# Patient Record
Sex: Female | Born: 2000 | Race: Black or African American | Hispanic: No | Marital: Single | Smoking: Current every day smoker
Health system: Southern US, Community
[De-identification: ages and names within clinical notes are randomized; demographics above are authoritative.]

## PROBLEM LIST (undated history)

## (undated) ENCOUNTER — Inpatient Hospital Stay (HOSPITAL_COMMUNITY): Payer: Self-pay

## (undated) DIAGNOSIS — J45909 Unspecified asthma, uncomplicated: Secondary | ICD-10-CM

## (undated) DIAGNOSIS — Z789 Other specified health status: Secondary | ICD-10-CM

## (undated) HISTORY — PX: TYMPANOSTOMY TUBE PLACEMENT: SHX32

## (undated) HISTORY — PX: NO PAST SURGERIES: SHX2092

---
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
## 2021-07-15 ENCOUNTER — Encounter (HOSPITAL_COMMUNITY): Payer: Self-pay | Admitting: Student

## 2021-07-15 ENCOUNTER — Emergency Department (HOSPITAL_COMMUNITY): Payer: Medicaid Other

## 2021-07-15 ENCOUNTER — Emergency Department (HOSPITAL_COMMUNITY)
Admission: EM | Admit: 2021-07-15 | Discharge: 2021-07-15 | Disposition: A | Discharge status: Home / Self Care | Payer: Medicaid Other | Attending: Emergency Medicine | Admitting: Emergency Medicine

## 2021-07-15 ENCOUNTER — Other Ambulatory Visit: Payer: Self-pay

## 2021-07-15 DIAGNOSIS — Y92481 Parking lot as the place of occurrence of the external cause: Secondary | ICD-10-CM | POA: Diagnosis not present

## 2021-07-15 DIAGNOSIS — S9031XA Contusion of right foot, initial encounter: Secondary | ICD-10-CM | POA: Insufficient documentation

## 2021-07-15 DIAGNOSIS — J4520 Mild intermittent asthma, uncomplicated: Secondary | ICD-10-CM | POA: Diagnosis not present

## 2021-07-15 DIAGNOSIS — S99921A Unspecified injury of right foot, initial encounter: Secondary | ICD-10-CM | POA: Diagnosis present
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
## 2021-07-15 NOTE — Discharge Instructions (Addendum)
Get help right away if: You suddenly develop severe pain in your foot. You previously had sensation in your foot and you suddenly lose sensation. Your symptoms had improved and they suddenly get worse. Your foot or toes are turning pink or blue. 
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
## 2021-10-03 NOTE — L&D Delivery Note (Signed)
OB/[facility]N Faculty Practice Delivery Note  Phyllis Yates is a 21 y.o. G2P1001 s/p VBAC at [redacted]w[redacted]d. She presented to the MAU in active labor.   ROM: 0h 67m with clear fluid, ruptured upon delivery  GBS Status:  Negative/-- (10/17 1131) Maximum Maternal Temperature:  Temp (24hrs), Avg:97.9 F (36.6 C), Min:97.5 F (36.4 C), Max:98.2 F (36.8 C)    Labor Progress: Patient arrived at 10 cm dilation and expectantly managed.   Delivery Date/Time: 08/08/2022 at [phone] Delivery: Patient presented to the MAU in active labor and complete with bulging bag at the vaginal introitus. Fetus with decels with contractions, so patient was encouraged to push. Head delivered in ROA position. No nuchal cord present. Shoulder and body delivered in usual fashion. Infant with spontaneous cry, placed on mother's abdomen, dried and stimulated. Cord clamped x 2 after 1-minute delay, and cut by FOB. Cord blood drawn. Placenta delivered spontaneously with gentle cord traction. Fundus firm with massage and Pitocin. However, there was a brisk flow followed by continuous trickle. Managed with TXA, IM methergine, and emptying of bladder with I&O catheter. At this point uterus was firm and bleeding had resolved. Labia, perineum, vagina, and cervix inspected with bilateral periurethral tears visualized.   Placenta: Delivered intact at [phone], trailing membranes required use of ring foreceps  Complications: Bleeding post partum, managed with postpartum pitocin, TXA, methergine, I%O cath, resolved bleeding  Lacerations: bilateral periurethral lacerations, hemostatic not requiring repair  EBL: 750 Analgesia: IV fentanyl 100 mcg post delivery    Infant: APGAR (1 MIN): 8   APGAR (5 MINS): 9   APGAR (10 MINS):    Weight: pending   Lowry Ram, MD  PGY-1, [facility]  08/08/2022 9:30 AM

## 2022-05-16 ENCOUNTER — Telehealth: Payer: Self-pay | Admitting: *Deleted

## 2022-05-16 NOTE — Telephone Encounter (Signed)
Left patient an urgent message with all appointment/office information.

## 2022-05-30 ENCOUNTER — Encounter: Payer: Self-pay | Admitting: *Deleted

## 2022-05-31 ENCOUNTER — Encounter: Payer: Self-pay | Admitting: Obstetrics and Gynecology

## 2022-05-31 ENCOUNTER — Other Ambulatory Visit (HOSPITAL_COMMUNITY)
Admission: RE | Admit: 2022-05-31 | Discharge: 2022-05-31 | Disposition: A | Discharge status: Home / Self Care | Payer: Medicaid Other | Source: Ambulatory Visit | Attending: Obstetrics and Gynecology | Admitting: Obstetrics and Gynecology

## 2022-05-31 ENCOUNTER — Ambulatory Visit (INDEPENDENT_AMBULATORY_CARE_PROVIDER_SITE_OTHER): Payer: Medicaid Other | Admitting: Obstetrics and Gynecology

## 2022-05-31 DIAGNOSIS — Z3A29 29 weeks gestation of pregnancy: Secondary | ICD-10-CM | POA: Diagnosis not present

## 2022-05-31 DIAGNOSIS — Z3483 Encounter for supervision of other normal pregnancy, third trimester: Secondary | ICD-10-CM

## 2022-05-31 DIAGNOSIS — Z3480 Encounter for supervision of other normal pregnancy, unspecified trimester: Secondary | ICD-10-CM | POA: Diagnosis not present

## 2022-05-31 DIAGNOSIS — Z3400 Encounter for supervision of normal first pregnancy, unspecified trimester: Secondary | ICD-10-CM

## 2022-05-31 LAB — HEPATITIS C ANTIBODY: HCV Ab: NEGATIVE

## 2022-05-31 MED ORDER — ONDANSETRON HCL 4 MG PO TABS: 4.0000 mg | ORAL_TABLET | Freq: Once | ORAL | Status: DC

## 2022-05-31 MED ORDER — ONDANSETRON HCL 8 MG PO TABS: 8.0000 mg | ORAL_TABLET | Freq: Three times a day (TID) | ORAL | 0 refills | Status: DC | PRN

## 2022-05-31 MED ORDER — ALBUTEROL SULFATE HFA 108 (90 BASE) MCG/ACT IN AERS: 1.0000 | INHALATION_SPRAY | Freq: Four times a day (QID) | RESPIRATORY_TRACT | 1 refills | Status: DC | PRN

## 2022-05-31 MED ORDER — PRENATAL GUMMIES/DHA & FA 0.4-32.5 MG PO CHEW: 2.0000 | CHEWABLE_TABLET | Freq: Every day | ORAL | 9 refills | Status: DC

## 2022-05-31 NOTE — Progress Notes (Signed)
History:   Phyllis Yates is a 21 y.o. G2P1001 at [redacted]w[redacted]d by LMP being seen today for her first obstetrical visit.  Her obstetrical history is significant for asthma, late to care and smoker. Patient does intend to breast feed. Pregnancy history fully reviewed. FOB involved. Mom is present today with her.  Her pregnancy she received care at [facility].   C/s with first baby due to fetal distress. Interested in [facility]   Patient reports no complaints.  HISTORY: OB History  Gravida Para Term Preterm AB Living  2 1 1  0 0 1  SAB IAB Ectopic Multiple Live Births  0 0 0 0 0    # Outcome Date GA Lbr Len/2nd Weight Sex Delivery Anes PTL Lv  2 Current           1 Term      CS-Unspec       Last pap smear was done NA age.   Past Medical History:  Diagnosis Date   Asthma    Medical history non-contributory    Past Surgical History:  Procedure Laterality Date   CESAREAN SECTION N/A 05/16/2019   Procedure: CESAREAN SECTION;  Surgeon: 05/18/2019, MD;  Location: [facility];  Service: Obstetrics;  Laterality: N/A;   NO PAST SURGERIES     TYMPANOSTOMY TUBE PLACEMENT     Family History  Problem Relation Age of Onset   Urticaria Father    Asthma Brother    Eczema Brother    Urticaria Paternal Aunt    Urticaria Paternal Grandfather    Social History   Tobacco Use   Smoking status: Never   Smokeless tobacco: Never  Vaping Use   Vaping Use: Never used  Substance Use Topics   Alcohol use: No   Drug use: Yes    Types: Marijuana   No Known Allergies Current Outpatient Medications on File Prior to Visit  Medication Sig Dispense Refill   acetaminophen (TYLENOL) 500 MG tablet Take 1,000 mg by mouth every 8 (eight) hours as needed for mild pain or headache. (Patient not taking: Reported on 05/31/2022)     cetirizine (ZYRTEC ALLERGY) 10 MG tablet Take 1 tablet (10 mg total) by mouth 2 (two) times daily. (Patient not taking: Reported on 05/16/2019) 60 tablet 0   EPINEPHrine (EPIPEN 2-PAK)  0.3 mg/0.3 mL IJ SOAJ injection Inject 0.3 mLs (0.3 mg total) into the muscle once as needed (for severe allergic reaction). CAll 911 immediately if you have to use this medicine (Patient not taking: Reported on 05/31/2022) 2 Device 0   fluticasone (FLONASE) 50 MCG/ACT nasal spray Place 2 sprays into both nostrils daily. (Patient not taking: Reported on 05/16/2019) 1 g 5   ibuprofen (ADVIL) 600 MG tablet Take 1 tablet (600 mg total) by mouth every 6 (six) hours as needed for mild pain. (Patient not taking: Reported on 05/31/2022) 30 tablet 0   oxyCODONE (OXY IR/ROXICODONE) 5 MG immediate release tablet Take 1-2 tablets (5-10 mg total) by mouth every 4 (four) hours as needed for moderate pain. (Patient not taking: Reported on 05/31/2022) 25 tablet 0   Current Facility-Administered Medications on File Prior to Visit  Medication Dose Route Frequency Provider Last Rate Last Admin   medroxyPROGESTERone (DEPO-PROVERA) injection 150 mg  150 mg Intramuscular Q90 days 06/02/2022, CNM   150 mg at 08/14/19 1021    Review of Systems Pertinent items noted in HPI and remainder of comprehensive ROS otherwise negative.  Physical Exam:   Vitals:  05/31/22 1334  BP: 105/69  Pulse: 92  Weight: 184 lb (83.5 kg)   Fetal Heart Rate (bpm): 156  Uterine size:    General: well-developed, well-nourished female in no acute distress  Breasts:  Deferred   Skin: normal coloration and turgor, no rashes  Neurologic: oriented, normal, negative, normal mood  Extremities: normal strength, tone, and muscle mass, ROM of all joints is normal  HEENT PERRLA, extraocular movement intact and sclera clear, anicteric  Neck supple and no masses  Cardiovascular: regular rate and rhythm  Respiratory:  no respiratory distress, normal breath sounds  Abdomen: soft, non-tender; bowel sounds normal; no masses,  no organomegaly  Pelvic: Deferred     Assessment:    Pregnancy: G2P1001 Patient Active Problem List   Diagnosis Date  Noted   Supervision of other normal pregnancy, antepartum 05/31/2022   Status post cesarean delivery 05/19/2019   Angioedema 06/11/2018   Mild intermittent asthma 06/11/2018     Plan:      1. Supervision of other normal pregnancy, antepartum  - HIV Antibody (routine testing w rflx) - Obstetric panel - Culture, OB Urine - Hepatitis C antibody - GC/Chlamydia probe amp ([place])not at [facility] - [place] MFM OB COMP + 14 WK; Future - HgB A1c - Scheduled 2 hour GTT ASAP  2. Supervision of normal first pregnancy, antepartum  - Prenatal MV-Min-FA-Omega-3 (PRENATAL GUMMIES/DHA & FA) 0.4-32.5 MG CHEW; Chew 2 each by mouth daily.  Dispense: 60 tablet; Refill: 9  - RX albuterol inhaler. Keep in your purse.    Initial labs drawn. Continue prenatal vitamins. Problem list reviewed and updated. Genetic Screening discussed, Panorama and Horizon: requested. Ultrasound discussed; fetal anatomic survey: planned. Anticipatory guidance about prenatal visits given including labs, ultrasounds, and testing. Weight gain recommendations per IOM guidelines reviewed: underweight/BMI 18.5 or less > 28 - 40 lbs; normal weight/BMI 18.5 - 24.9 > 25 - 35 lbs; overweight/BMI 25 - 29.9 > 15 - 25 lbs; obese/BMI  30 or more > 11 - 20 lbs. Discussed usage of the Babyscripts app for more information about pregnancy, and to track blood pressures. Also discussed usage of virtual visits as additional source of managing and completing prenatal visits.  Patient was encouraged to use MyChart to review results, send requests, and have questions addressed.   The nature of [place] [facility] with multiple MDs and Advanced Practice Providers was explained to patient; also emphasized that residents, students are part of our team. Routine obstetric precautions reviewed. Encouraged to seek out care at our office or emergency room [facility] MAU preferred) for urgent and/or emergent  concerns. Return for Glucose testing ASAP.     Sohail Capraro, Harolyn Rutherford, NP Faculty Practice [facility] [employer], [facility]

## 2022-06-01 ENCOUNTER — Telehealth: Payer: Self-pay | Admitting: *Deleted

## 2022-06-01 LAB — GC/CHLAMYDIA PROBE AMP (~~LOC~~) NOT AT ARMC
Comment: NEGATIVE
Comment: NORMAL

## 2022-06-01 NOTE — Telephone Encounter (Signed)
Left patient an urgent message to call the office to get appointment scheduled as soon as possible.

## 2022-06-02 LAB — OBSTETRIC PANEL
Absolute Monocytes: 410 cells/uL (ref 200–950)
Antibody Screen: NOT DETECTED
Basophils Absolute: 30 cells/uL (ref 0–200)
Basophils Relative: 0.3 %
Eosinophils Absolute: 90 cells/uL (ref 15–500)
Eosinophils Relative: 0.9 %
HCT: 34.3 % — ABNORMAL LOW (ref 35.0–45.0)
Hemoglobin: 10.6 g/dL — ABNORMAL LOW (ref 11.7–15.5)
Hepatitis B Surface Ag: NONREACTIVE
Lymphs Abs: 880 cells/uL (ref 850–3900)
MCH: 28.3 pg (ref 27.0–33.0)
MCHC: 30.9 g/dL — ABNORMAL LOW (ref 32.0–36.0)
MCV: 91.7 fL (ref 80.0–100.0)
MPV: 10.8 fL (ref 7.5–12.5)
Monocytes Relative: 4.1 %
Neutro Abs: 8590 cells/uL — ABNORMAL HIGH (ref 1500–7800)
Neutrophils Relative %: 85.9 %
Platelets: 204 10*3/uL (ref 140–400)
RBC: 3.74 10*6/uL — ABNORMAL LOW (ref 3.80–5.10)
RDW: 12.6 % (ref 11.0–15.0)
RPR Ser Ql: NONREACTIVE
Rubella: 3.58 Index
Total Lymphocyte: 8.8 %
WBC: 10 10*3/uL (ref 3.8–10.8)

## 2022-06-02 LAB — HEMOGLOBIN A1C
Hgb A1c MFr Bld: 5.1 % of total Hgb (ref <5.7)
Mean Plasma Glucose: 100 mg/dL
eAG (mmol/L): 5.5 mmol/L

## 2022-06-02 LAB — HEPATITIS C ANTIBODY: Hepatitis C Ab: NONREACTIVE

## 2022-06-02 LAB — CULTURE, OB URINE

## 2022-06-02 LAB — URINE CULTURE, OB REFLEX

## 2022-06-02 LAB — HIV ANTIBODY (ROUTINE TESTING W REFLEX): HIV 1&2 Ab, 4th Generation: NONREACTIVE

## 2022-06-10 ENCOUNTER — Ambulatory Visit (INDEPENDENT_AMBULATORY_CARE_PROVIDER_SITE_OTHER): Payer: Medicaid Other | Admitting: Obstetrics and Gynecology

## 2022-06-10 VITALS — BP 119/64 | HR 83 | Wt 179.0 lb

## 2022-06-10 DIAGNOSIS — Z720 Tobacco use: Secondary | ICD-10-CM

## 2022-06-10 DIAGNOSIS — Z98891 History of uterine scar from previous surgery: Secondary | ICD-10-CM

## 2022-06-10 DIAGNOSIS — Z3483 Encounter for supervision of other normal pregnancy, third trimester: Secondary | ICD-10-CM

## 2022-06-10 DIAGNOSIS — O0930 Supervision of pregnancy with insufficient antenatal care, unspecified trimester: Secondary | ICD-10-CM | POA: Insufficient documentation

## 2022-06-10 DIAGNOSIS — Z3A30 30 weeks gestation of pregnancy: Secondary | ICD-10-CM

## 2022-06-10 DIAGNOSIS — Z3480 Encounter for supervision of other normal pregnancy, unspecified trimester: Secondary | ICD-10-CM

## 2022-06-10 DIAGNOSIS — O0933 Supervision of pregnancy with insufficient antenatal care, third trimester: Secondary | ICD-10-CM

## 2022-06-10 MED ORDER — NICOTINE 21 MG/24HR TD PT24: 21.0000 mg | MEDICATED_PATCH | Freq: Every day | TRANSDERMAL | 0 refills | Status: DC

## 2022-06-10 NOTE — Progress Notes (Signed)
PRENATAL VISIT NOTE  Subjective:  Phyllis Yates is a 21 y.o. G2P1001 at [redacted]w[redacted]d being seen today for ongoing prenatal care.  She is currently monitored for the following issues for this low-risk pregnancy and has Angioedema; Mild intermittent asthma; Status post cesarean delivery; Supervision of other normal pregnancy, antepartum; Tobacco abuse; and Late prenatal care on their problem list.  Patient reports no complaints.  Contractions: Not present. Vag. Bleeding: None.  Movement: Present. Denies leaking of fluid.   The following portions of the patient's history were reviewed and updated as appropriate: allergies, current medications, past family history, past medical history, past social history, past surgical history and problem list.   Objective:   Vitals:   06/10/22 1022  BP: 119/64  Pulse: 83  Weight: 179 lb (81.2 kg)    Fetal Status: Fetal Heart Rate (bpm): 135 Fundal Height: 30 cm Movement: Present     General:  Alert, oriented and cooperative. Patient is in no acute distress.  Skin: Skin is warm and dry. No rash noted.   Cardiovascular: Normal heart rate noted  Respiratory: Normal respiratory effort, no problems with respiration noted  Abdomen: Soft, gravid, appropriate for gestational age.  Pain/Pressure: Present     Pelvic: Cervical exam deferred        Extremities: Normal range of motion.  Edema: None  Mental Status: Normal mood and affect. Normal behavior. Normal judgment and thought content.   Assessment and Plan:  Pregnancy: G2P1001 at [redacted]w[redacted]d 1. Supervision of other normal pregnancy, antepartum Will schedule for anatomy scan Needs 2 hr gtt - she will come for lab draw next week.  Offered and recommended flu shot. Pt declines today but would be willing next time.   2. Status post cesarean delivery - We discussed her history of c-section. Her previous c-section was due to  non-reassuring fetal heart tones and she dilated to 5 cm.  She has a history of  no prior  successful vaginal deliveries - We discussed the risks associated with repeat c-section: bleeding, infection, injury to surrounding organs/tissues I.e. bowel/bladder, development of scar tissue, wound complications such as wound separation or infection, need for additional surgery, percreta/acreta - We discussed the risks associated with TOLAC: risk of it being unsuccessful, specially in the context of her history, the risks in general of a vaginal delivery (prolapse, SUI, differences in recovery, pelvic floor dysfunction, etc), and the risk of uterine rupture. We discussed with the risk of uterine rupture that while rare it is not easily predicted, that it is a surgical emergency, and it can be potentially catastrophic for mom and baby. We discussed if uterine rupture that it may necessitate hysterectomy if the rupture caused issues with bleeding that could not be managed with other surgical options.  - After counseling, the patient was given the opportunity to ask questions and all questions answered.  - After considering her options, she would like to [facility] - Information provided to the patient  3. Tobacco abuse Discussed options for help with cessation. She would like to try the patch. She reports she has cut back a lot. Will do 21 mg patch and then will need to taper after next time  Preterm labor symptoms and general obstetric precautions including but not limited to vaginal bleeding, contractions, leaking of fluid and fetal movement were reviewed in detail with the patient. Please refer to After Visit Summary for other counseling recommendations.   Return in about 2 weeks (around 06/24/2022) for OB VISIT, MD or APP, 2  hr GTT.  Future Appointments  Date Time Provider Department Center  07/01/2022  1:45 PM [facility] US[facility]    Milas Hock, MD

## 2022-06-15 ENCOUNTER — Other Ambulatory Visit: Payer: Medicaid Other

## 2022-06-23 ENCOUNTER — Ambulatory Visit (INDEPENDENT_AMBULATORY_CARE_PROVIDER_SITE_OTHER): Payer: Medicaid Other | Admitting: Family Medicine

## 2022-06-23 ENCOUNTER — Encounter: Payer: Medicaid Other | Admitting: Family Medicine

## 2022-06-23 VITALS — BP 115/64 | HR 83 | Wt 188.0 lb

## 2022-06-23 DIAGNOSIS — Z3483 Encounter for supervision of other normal pregnancy, third trimester: Secondary | ICD-10-CM

## 2022-06-23 DIAGNOSIS — Z98891 History of uterine scar from previous surgery: Secondary | ICD-10-CM

## 2022-06-23 DIAGNOSIS — J4520 Mild intermittent asthma, uncomplicated: Secondary | ICD-10-CM

## 2022-06-23 DIAGNOSIS — Z720 Tobacco use: Secondary | ICD-10-CM

## 2022-06-23 DIAGNOSIS — Z3480 Encounter for supervision of other normal pregnancy, unspecified trimester: Secondary | ICD-10-CM

## 2022-06-23 DIAGNOSIS — Z3A32 32 weeks gestation of pregnancy: Secondary | ICD-10-CM

## 2022-06-23 NOTE — Progress Notes (Signed)
   PRENATAL VISIT NOTE  Subjective:  Phyllis Yates is a 21 y.o. G2P1001 at [redacted]w[redacted]d being seen today for ongoing prenatal care.  She is currently monitored for the following issues for this low-risk pregnancy and has Angioedema; Mild intermittent asthma; Status post cesarean delivery; Supervision of other normal pregnancy, antepartum; Tobacco abuse; and Late prenatal care on their problem list.  Patient reports no complaints.  Contractions: Not present. Vag. Bleeding: None.  Movement: Present. Denies leaking of fluid.   The following portions of the patient's history were reviewed and updated as appropriate: allergies, current medications, past family history, past medical history, past social history, past surgical history and problem list.   Objective:   Vitals:   06/23/22 0939  BP: 115/64  Pulse: 83  Weight: 188 lb (85.3 kg)    Fetal Status: Fetal Heart Rate (bpm): 145   Movement: Present     General:  Alert, oriented and cooperative. Patient is in no acute distress.  Skin: Skin is warm and dry. No rash noted.   Cardiovascular: Normal heart rate noted  Respiratory: Normal respiratory effort, no problems with respiration noted  Abdomen: Soft, gravid, appropriate for gestational age.  Pain/Pressure: Present     Pelvic: Cervical exam deferred        Extremities: Normal range of motion.  Edema: None  Mental Status: Normal mood and affect. Normal behavior. Normal judgment and thought content.   Assessment and Plan:  Pregnancy: G2P1001 at [redacted]w[redacted]d 1. [redacted] weeks gestation of pregnancy - RPR - CBC - Glucose Tolerance, 2 Hours w/1 Hour - HIV Antibody (routine testing w rflx)  2. Supervision of other normal pregnancy, antepartum FHT and FH normal  3. Status post cesarean delivery Still considering TOLAC  4. Mild intermittent asthma, unspecified whether complicated Controlled - uses SABA once every couple of weeks  5. Tobacco abuse Still smoking - has not picked up patches  yet.  Preterm labor symptoms and general obstetric precautions including but not limited to vaginal bleeding, contractions, leaking of fluid and fetal movement were reviewed in detail with the patient. Please refer to After Visit Summary for other counseling recommendations.   No follow-ups on file.  Future Appointments  Date Time Provider [place]  07/01/2022  2:00 PM [facility] US1 [facility]  07/07/2022  1:30 PM Truett Mainland, DO [facility] None  07/19/2022 10:55 AM Laury Deep, CNM [facility] None  07/26/2022 10:55 AM Seabron Spates, CNM [facility] None  08/04/2022  1:10 PM Truett Mainland, DO [facility] None  08/11/2022  2:10 PM Truett Mainland, DO [facility] None    Truett Mainland, DO

## 2022-06-24 LAB — CBC
Hematocrit: 31.8 % — ABNORMAL LOW (ref 34.0–46.6)
Hemoglobin: 10.4 g/dL — ABNORMAL LOW (ref 11.1–15.9)
MCH: 28.5 pg (ref 26.6–33.0)
MCHC: 32.7 g/dL (ref 31.5–35.7)
MCV: 87 fL (ref 79–97)
Platelets: 218 10*3/uL (ref 150–450)
RBC: 3.65 x10E6/uL — ABNORMAL LOW (ref 3.77–5.28)
RDW: 13.1 % (ref 11.7–15.4)
WBC: 10.8 10*3/uL (ref 3.4–10.8)

## 2022-06-24 LAB — GLUCOSE TOLERANCE, 2 HOURS W/ 1HR
Glucose, 1 hour: 89 mg/dL (ref 70–179)
Glucose, 2 hour: 79 mg/dL (ref 70–152)
Glucose, Fasting: 77 mg/dL (ref 70–91)

## 2022-06-24 LAB — HIV ANTIBODY (ROUTINE TESTING W REFLEX): HIV Screen 4th Generation wRfx: NONREACTIVE

## 2022-06-24 LAB — RPR: RPR Ser Ql: NONREACTIVE

## 2022-06-29 ENCOUNTER — Encounter: Payer: Medicaid Other | Admitting: Family Medicine

## 2022-07-01 ENCOUNTER — Ambulatory Visit: Payer: Medicaid Other | Admitting: *Deleted

## 2022-07-01 ENCOUNTER — Ambulatory Visit: Payer: Medicaid Other | Attending: Obstetrics and Gynecology

## 2022-07-01 ENCOUNTER — Other Ambulatory Visit: Payer: Self-pay | Admitting: *Deleted

## 2022-07-01 VITALS — BP 111/67 | HR 76

## 2022-07-01 DIAGNOSIS — Z3A33 33 weeks gestation of pregnancy: Secondary | ICD-10-CM | POA: Insufficient documentation

## 2022-07-01 DIAGNOSIS — O0933 Supervision of pregnancy with insufficient antenatal care, third trimester: Secondary | ICD-10-CM | POA: Diagnosis not present

## 2022-07-01 DIAGNOSIS — Z3480 Encounter for supervision of other normal pregnancy, unspecified trimester: Secondary | ICD-10-CM

## 2022-07-01 DIAGNOSIS — O34219 Maternal care for unspecified type scar from previous cesarean delivery: Secondary | ICD-10-CM | POA: Diagnosis not present

## 2022-07-07 ENCOUNTER — Encounter: Payer: Medicaid Other | Admitting: Family Medicine

## 2022-07-19 ENCOUNTER — Ambulatory Visit (INDEPENDENT_AMBULATORY_CARE_PROVIDER_SITE_OTHER): Payer: Medicaid Other | Admitting: Obstetrics and Gynecology

## 2022-07-19 ENCOUNTER — Encounter: Payer: Self-pay | Admitting: Obstetrics and Gynecology

## 2022-07-19 ENCOUNTER — Encounter: Payer: Self-pay | Admitting: General Practice

## 2022-07-19 ENCOUNTER — Other Ambulatory Visit (HOSPITAL_COMMUNITY)
Admission: RE | Admit: 2022-07-19 | Discharge: 2022-07-19 | Disposition: A | Discharge status: Home / Self Care | Payer: Medicaid Other | Source: Ambulatory Visit | Attending: Obstetrics and Gynecology | Admitting: Obstetrics and Gynecology

## 2022-07-19 VITALS — BP 110/58 | HR 71 | Wt 189.0 lb

## 2022-07-19 DIAGNOSIS — Z3A36 36 weeks gestation of pregnancy: Secondary | ICD-10-CM | POA: Insufficient documentation

## 2022-07-19 DIAGNOSIS — Z3480 Encounter for supervision of other normal pregnancy, unspecified trimester: Secondary | ICD-10-CM

## 2022-07-19 DIAGNOSIS — O34219 Maternal care for unspecified type scar from previous cesarean delivery: Secondary | ICD-10-CM

## 2022-07-19 NOTE — Progress Notes (Signed)
   LOW-RISK PREGNANCY OFFICE VISIT Patient name: Phyllis Yates MRN [id number]  Date of birth: June 28, 2001 Chief Complaint:   No chief complaint on file.  History of Present Illness:   Phyllis Yates is a 21 y.o. G70P1001 female at [redacted]w[redacted]d with an Estimated Date of Delivery: 08/15/22 being seen today for ongoing management of a low-risk pregnancy.  Today she reports occasional contractions. Contractions: Not present. Vag. Bleeding: None.  Movement: Present. denies leaking of fluid. Review of Systems:   Pertinent items are noted in HPI Denies abnormal vaginal discharge w/ itching/odor/irritation, headaches, visual changes, shortness of breath, chest pain, abdominal pain, severe nausea/vomiting, or problems with urination or bowel movements unless otherwise stated above. Pertinent History Reviewed:  Reviewed past medical,surgical, social, obstetrical and family history.  Reviewed problem list, medications and allergies. Physical Assessment:   Vitals:   07/19/22 1102  BP: (!) 110/58  Pulse: 71  Weight: 189 lb (85.7 kg)  Body mass index is 30.51 kg/m.        Physical Examination:   General appearance: Well appearing, and in no distress  Mental status: Alert, oriented to person, place, and time  Skin: Warm & dry  Cardiovascular: Normal heart rate noted  Respiratory: Normal respiratory effort, no distress  Abdomen: Soft, gravid, nontender  Pelvic: Cervical exam performed  Dilation: 1 Effacement (%): 50 Station: -3  Extremities: Edema: None  Fetal Status:   Fundal Height: 34 cm Movement: Present Presentation: Vertex  No results found for this or any previous visit (from the past 24 hour(s)).  Assessment & Plan:  1) Low-risk pregnancy G2P1001 at [redacted]w[redacted]d with an Estimated Date of Delivery: 08/15/22   2) Supervision of other normal pregnancy, antepartum  - Culture, beta strep (group b only),  - GC/Chlamydia probe amp ([place])not at [facility] - Discussed labor s/sx's.  3) Previous cesarean  delivery affecting pregnancy  - Planning TOLAC >> consent signed previously  4) [redacted] weeks gestation of pregnancy   Meds: No orders of the defined types were placed in this encounter.  Labs/procedures today: GBS, GC/CT and cervical exam  Plan:  Continue routine obstetrical care   Reviewed: Preterm labor symptoms and general obstetric precautions including but not limited to vaginal bleeding, contractions, leaking of fluid and fetal movement were reviewed in detail with the patient.  All questions were answered. Has home bp cuff. Check bp weekly, let us know if >140/90.   Follow-up: Return in about 1 week (around 07/26/2022) for Return OB visit.  Orders Placed This Encounter  Procedures   Culture, beta strep (group b only)   Laury Deep MSN, CNM 07/19/2022 11:36 AM

## 2022-07-20 LAB — GC/CHLAMYDIA PROBE AMP (~~LOC~~) NOT AT ARMC
Chlamydia: NEGATIVE
Comment: NEGATIVE
Comment: NORMAL
Neisseria Gonorrhea: NEGATIVE

## 2022-07-23 LAB — CULTURE, BETA STREP (GROUP B ONLY): Strep Gp B Culture: NEGATIVE

## 2022-07-24 ENCOUNTER — Encounter: Payer: Self-pay | Admitting: Obstetrics and Gynecology

## 2022-07-26 ENCOUNTER — Encounter: Payer: Medicaid Other | Admitting: Advanced Practice Midwife

## 2022-07-29 ENCOUNTER — Ambulatory Visit: Payer: Medicaid Other | Attending: Obstetrics and Gynecology

## 2022-07-29 ENCOUNTER — Ambulatory Visit: Payer: Medicaid Other | Admitting: *Deleted

## 2022-07-29 VITALS — BP 120/67 | HR 79

## 2022-07-29 DIAGNOSIS — O34219 Maternal care for unspecified type scar from previous cesarean delivery: Secondary | ICD-10-CM | POA: Diagnosis not present

## 2022-07-29 DIAGNOSIS — Z3A37 37 weeks gestation of pregnancy: Secondary | ICD-10-CM | POA: Diagnosis not present

## 2022-07-29 DIAGNOSIS — O0933 Supervision of pregnancy with insufficient antenatal care, third trimester: Secondary | ICD-10-CM | POA: Insufficient documentation

## 2022-07-29 DIAGNOSIS — Z3A27 27 weeks gestation of pregnancy: Secondary | ICD-10-CM | POA: Diagnosis not present

## 2022-07-29 DIAGNOSIS — Z3480 Encounter for supervision of other normal pregnancy, unspecified trimester: Secondary | ICD-10-CM | POA: Insufficient documentation

## 2022-07-29 DIAGNOSIS — O99333 Smoking (tobacco) complicating pregnancy, third trimester: Secondary | ICD-10-CM | POA: Diagnosis not present

## 2022-08-04 ENCOUNTER — Ambulatory Visit (INDEPENDENT_AMBULATORY_CARE_PROVIDER_SITE_OTHER): Payer: Medicaid Other | Admitting: Family Medicine

## 2022-08-04 VITALS — BP 117/67 | HR 75 | Wt 183.0 lb

## 2022-08-04 DIAGNOSIS — Z98891 History of uterine scar from previous surgery: Secondary | ICD-10-CM

## 2022-08-04 DIAGNOSIS — Z720 Tobacco use: Secondary | ICD-10-CM

## 2022-08-04 DIAGNOSIS — Z3A38 38 weeks gestation of pregnancy: Secondary | ICD-10-CM

## 2022-08-04 DIAGNOSIS — Z3480 Encounter for supervision of other normal pregnancy, unspecified trimester: Secondary | ICD-10-CM

## 2022-08-04 DIAGNOSIS — J4520 Mild intermittent asthma, uncomplicated: Secondary | ICD-10-CM

## 2022-08-04 NOTE — Progress Notes (Signed)
   PRENATAL VISIT NOTE  Subjective:  Phyllis Yates is a 21 y.o. G2P1001 at [redacted]w[redacted]d being seen today for ongoing prenatal care.  She is currently monitored for the following issues for this low-risk pregnancy and has Angioedema; Mild intermittent asthma; Status post cesarean delivery; Supervision of other normal pregnancy, antepartum; Tobacco abuse; and Late prenatal care on their problem list.  Patient reports occasional contractions.  Contractions: Irritability.  .  Movement: Present. Denies leaking of fluid.   The following portions of the patient's history were reviewed and updated as appropriate: allergies, current medications, past family history, past medical history, past social history, past surgical history and problem list.   Objective:   Vitals:   08/04/22 1357  BP: 117/67  Pulse: 75  Weight: 183 lb (83 kg)    Fetal Status: Fetal Heart Rate (bpm): 140   Movement: Present     General:  Alert, oriented and cooperative. Patient is in no acute distress.  Skin: Skin is warm and dry. No rash noted.   Cardiovascular: Normal heart rate noted  Respiratory: Normal respiratory effort, no problems with respiration noted  Abdomen: Soft, gravid, appropriate for gestational age.  Pain/Pressure: Present     Pelvic: Cervical exam deferred        Extremities: Normal range of motion.  Edema: Trace  Mental Status: Normal mood and affect. Normal behavior. Normal judgment and thought content.   Assessment and Plan:  Pregnancy: G2P1001 at [redacted]w[redacted]d 1. Supervision of other normal pregnancy, antepartum FHT and Fh normal BPP if gets to term. Weekly appt until delivery. - [place] MFM FETAL BPP WO NON STRESS; Future  2. Status post cesarean delivery Desires TOLAC.  3. Tobacco abuse  4. Mild intermittent asthma, unspecified whether complicated   Term labor symptoms and general obstetric precautions including but not limited to vaginal bleeding, contractions, leaking of fluid and fetal movement were  reviewed in detail with the patient. Please refer to After Visit Summary for other counseling recommendations.   No follow-ups on file.  Future Appointments  Date Time Provider [place]  08/11/2022  2:10 PM Truett Mainland, DO [facility] None    Truett Mainland, DO

## 2022-08-08 ENCOUNTER — Inpatient Hospital Stay (HOSPITAL_COMMUNITY)
Admission: AD | Admit: 2022-08-08 | Discharge: 2022-08-10 | DRG: 807 | Disposition: A | Discharge status: Home / Self Care | Payer: Medicaid Other | Attending: Family Medicine | Admitting: Family Medicine

## 2022-08-08 ENCOUNTER — Encounter (HOSPITAL_COMMUNITY): Payer: Self-pay | Admitting: Family Medicine

## 2022-08-08 DIAGNOSIS — Z3A39 39 weeks gestation of pregnancy: Secondary | ICD-10-CM

## 2022-08-08 DIAGNOSIS — O99511 Diseases of the respiratory system complicating pregnancy, first trimester: Secondary | ICD-10-CM | POA: Diagnosis not present

## 2022-08-08 DIAGNOSIS — O9952 Diseases of the respiratory system complicating childbirth: Secondary | ICD-10-CM | POA: Diagnosis present

## 2022-08-08 DIAGNOSIS — O34219 Maternal care for unspecified type scar from previous cesarean delivery: Secondary | ICD-10-CM | POA: Diagnosis present

## 2022-08-08 DIAGNOSIS — O99334 Smoking (tobacco) complicating childbirth: Secondary | ICD-10-CM | POA: Diagnosis not present

## 2022-08-08 DIAGNOSIS — J4520 Mild intermittent asthma, uncomplicated: Secondary | ICD-10-CM | POA: Diagnosis present

## 2022-08-08 DIAGNOSIS — O9902 Anemia complicating childbirth: Secondary | ICD-10-CM | POA: Diagnosis present

## 2022-08-08 DIAGNOSIS — Z87891 Personal history of nicotine dependence: Secondary | ICD-10-CM | POA: Diagnosis not present

## 2022-08-08 DIAGNOSIS — O26893 Other specified pregnancy related conditions, third trimester: Secondary | ICD-10-CM | POA: Diagnosis present

## 2022-08-08 DIAGNOSIS — Z23 Encounter for immunization: Secondary | ICD-10-CM | POA: Diagnosis not present

## 2022-08-08 DIAGNOSIS — O34211 Maternal care for low transverse scar from previous cesarean delivery: Secondary | ICD-10-CM | POA: Diagnosis not present

## 2022-08-08 LAB — CBC
HCT: 30.7 % — ABNORMAL LOW (ref 36.0–46.0)
Hemoglobin: 10.4 g/dL — ABNORMAL LOW (ref 12.0–15.0)
MCH: 29.1 pg (ref 26.0–34.0)
MCHC: 33.9 g/dL (ref 30.0–36.0)
MCV: 85.8 fL (ref 80.0–100.0)
Platelets: 219 10*3/uL (ref 150–400)
RBC: 3.58 MIL/uL — ABNORMAL LOW (ref 3.87–5.11)
RDW: 13.9 % (ref 11.5–15.5)
WBC: 13.1 10*3/uL — ABNORMAL HIGH (ref 4.0–10.5)
nRBC: 0 % (ref 0.0–0.2)

## 2022-08-08 LAB — RPR: RPR Ser Ql: NONREACTIVE

## 2022-08-08 LAB — TYPE AND SCREEN
ABO/RH(D): B POS
Antibody Screen: NEGATIVE

## 2022-08-08 MED ORDER — ZOLPIDEM TARTRATE 5 MG PO TABS: 5.0000 mg | ORAL_TABLET | Freq: Every evening | ORAL | Status: DC | PRN

## 2022-08-08 MED ORDER — ONDANSETRON HCL 4 MG PO TABS: 4.0000 mg | ORAL_TABLET | ORAL | Status: DC | PRN

## 2022-08-08 MED ORDER — IBUPROFEN 600 MG PO TABS
600.0000 mg | ORAL_TABLET | Freq: Four times a day (QID) | ORAL | Status: DC
  Administered 2022-08-08 – 2022-08-10 (×7): 600 mg via ORAL
  Filled 2022-08-08 (×9): qty 1

## 2022-08-08 MED ORDER — FENTANYL CITRATE (PF) 100 MCG/2ML IJ SOLN
INTRAMUSCULAR | Status: AC
  Filled 2022-08-08: qty 2

## 2022-08-08 MED ORDER — ONDANSETRON HCL 4 MG/2ML IJ SOLN: 4.0000 mg | Freq: Four times a day (QID) | INTRAMUSCULAR | Status: DC | PRN

## 2022-08-08 MED ORDER — ACETAMINOPHEN 325 MG PO TABS: 650.0000 mg | ORAL_TABLET | ORAL | Status: DC | PRN

## 2022-08-08 MED ORDER — OXYTOCIN 10 UNIT/ML IJ SOLN
INTRAMUSCULAR | Status: AC
  Administered 2022-08-08: 10 [IU]
  Filled 2022-08-08: qty 1

## 2022-08-08 MED ORDER — BENZOCAINE-MENTHOL 20-0.5 % EX AERO
1.0000 | INHALATION_SPRAY | CUTANEOUS | Status: DC | PRN
  Administered 2022-08-08: 1 via TOPICAL
  Filled 2022-08-08: qty 56

## 2022-08-08 MED ORDER — DIBUCAINE (PERIANAL) 1 % EX OINT: 1.0000 | TOPICAL_OINTMENT | CUTANEOUS | Status: DC | PRN

## 2022-08-08 MED ORDER — METHYLERGONOVINE MALEATE 0.2 MG/ML IJ SOLN
INTRAMUSCULAR | Status: AC
  Filled 2022-08-08: qty 1

## 2022-08-08 MED ORDER — SENNOSIDES-DOCUSATE SODIUM 8.6-50 MG PO TABS
2.0000 | ORAL_TABLET | Freq: Every day | ORAL | Status: DC
  Administered 2022-08-09 – 2022-08-10 (×2): 2 via ORAL
  Filled 2022-08-08 (×2): qty 2

## 2022-08-08 MED ORDER — OXYCODONE HCL 5 MG PO TABS: 5.0000 mg | ORAL_TABLET | ORAL | Status: DC | PRN

## 2022-08-08 MED ORDER — METHYLERGONOVINE MALEATE 0.2 MG/ML IJ SOLN
0.2000 mg | Freq: Once | INTRAMUSCULAR | Status: AC
  Administered 2022-08-08: 0.2 mg via INTRAMUSCULAR

## 2022-08-08 MED ORDER — ACETAMINOPHEN 325 MG PO TABS
650.0000 mg | ORAL_TABLET | ORAL | Status: DC | PRN
  Administered 2022-08-09 – 2022-08-10 (×2): 650 mg via ORAL
  Filled 2022-08-08 (×2): qty 2

## 2022-08-08 MED ORDER — TRANEXAMIC ACID-NACL 1000-0.7 MG/100ML-% IV SOLN
INTRAVENOUS | Status: AC
  Administered 2022-08-08: 1000 mg via INTRAVENOUS
  Filled 2022-08-08: qty 100

## 2022-08-08 MED ORDER — LIDOCAINE HCL (PF) 1 % IJ SOLN: 30.0000 mL | INTRAMUSCULAR | Status: DC | PRN

## 2022-08-08 MED ORDER — TRANEXAMIC ACID-NACL 1000-0.7 MG/100ML-% IV SOLN: 1000.0000 mg | INTRAVENOUS | Status: AC

## 2022-08-08 MED ORDER — WITCH HAZEL-GLYCERIN EX PADS: 1.0000 | MEDICATED_PAD | CUTANEOUS | Status: DC | PRN

## 2022-08-08 MED ORDER — LACTATED RINGERS IV SOLN: INTRAVENOUS | Status: DC

## 2022-08-08 MED ORDER — SOD CITRATE-CITRIC ACID 500-334 MG/5ML PO SOLN: 30.0000 mL | ORAL | Status: DC | PRN

## 2022-08-08 MED ORDER — ONDANSETRON HCL 4 MG/2ML IJ SOLN
4.0000 mg | INTRAMUSCULAR | Status: DC | PRN
  Administered 2022-08-08: 4 mg via INTRAVENOUS
  Filled 2022-08-08: qty 2

## 2022-08-08 MED ORDER — OXYCODONE HCL 5 MG PO TABS: 10.0000 mg | ORAL_TABLET | ORAL | Status: DC | PRN

## 2022-08-08 MED ORDER — FENTANYL CITRATE (PF) 100 MCG/2ML IJ SOLN
100.0000 ug | Freq: Once | INTRAMUSCULAR | Status: AC
  Administered 2022-08-08: 100 ug via INTRAVENOUS

## 2022-08-08 MED ORDER — ERYTHROMYCIN 5 MG/GM OP OINT
TOPICAL_OINTMENT | OPHTHALMIC | Status: AC
  Filled 2022-08-08: qty 1

## 2022-08-08 MED ORDER — LACTATED RINGERS IV SOLN: 500.0000 mL | INTRAVENOUS | Status: DC | PRN

## 2022-08-08 MED ORDER — COCONUT OIL OIL
1.0000 | TOPICAL_OIL | Status: DC | PRN
  Administered 2022-08-10: 1 via TOPICAL

## 2022-08-08 MED ORDER — SIMETHICONE 80 MG PO CHEW: 80.0000 mg | CHEWABLE_TABLET | ORAL | Status: DC | PRN

## 2022-08-08 MED ORDER — PRENATAL MULTIVITAMIN CH
1.0000 | ORAL_TABLET | Freq: Every day | ORAL | Status: DC
  Administered 2022-08-09 – 2022-08-10 (×2): 1 via ORAL
  Filled 2022-08-08 (×2): qty 1

## 2022-08-08 MED ORDER — TETANUS-DIPHTH-ACELL PERTUSSIS 5-2.5-18.5 LF-MCG/0.5 IM SUSY
0.5000 mL | PREFILLED_SYRINGE | Freq: Once | INTRAMUSCULAR | Status: AC
  Administered 2022-08-09: 0.5 mL via INTRAMUSCULAR
  Filled 2022-08-08: qty 0.5

## 2022-08-08 MED ORDER — DIPHENHYDRAMINE HCL 25 MG PO CAPS: 25.0000 mg | ORAL_CAPSULE | Freq: Four times a day (QID) | ORAL | Status: DC | PRN

## 2022-08-08 MED ORDER — INFLUENZA VAC SPLIT QUAD 0.5 ML IM SUSY
0.5000 mL | PREFILLED_SYRINGE | INTRAMUSCULAR | Status: AC
  Administered 2022-08-09: 0.5 mL via INTRAMUSCULAR
  Filled 2022-08-08: qty 0.5

## 2022-08-08 NOTE — MAU Provider Note (Signed)
MAU Provider Note  History   Patient came to MAU in active labor, visibly uncomfortable with contractions and with bulging bag at the introitus. She felt the urge to push. Patient had been having contractions since last night, but grew more uncomfortable and called EMS. No bleeding, vision changes, headaches.     CSN: [id number]  Arrival date and time: 08/08/22 0867   Patient has history of CS 3 years prior for NRFHT. Follow with [facility] and had signed TOLAC papers.  OB History     Gravida  2   Para  1   Term  1   Preterm      AB      Living  1      SAB      IAB      Ectopic      Multiple      Live Births              Past Medical History:  Diagnosis Date   Asthma    Medical history non-contributory     Past Surgical History:  Procedure Laterality Date   CESAREAN SECTION N/A 05/16/2019   Procedure: CESAREAN SECTION;  Surgeon: Chancy Milroy, MD;  Location: [facility];  Service: Obstetrics;  Laterality: N/A;   NO PAST SURGERIES     TYMPANOSTOMY TUBE PLACEMENT      Family History  Problem Relation Age of Onset   Urticaria Father    Asthma Brother    Eczema Brother    Urticaria Paternal Aunt    Urticaria Paternal Grandfather     Social History   Tobacco Use   Smoking status: Never   Smokeless tobacco: Never  Vaping Use   Vaping Use: Never used  Substance Use Topics   Alcohol use: No   Drug use: Yes    Types: Marijuana    Comment: last [place] of weed  Jun 12 2022    Allergies: No Known Allergies  Medications Prior to Admission  Medication Sig Dispense Refill Last Dose   albuterol (VENTOLIN HFA) 108 (90 Base) MCG/ACT inhaler Inhale 1-2 puffs into the lungs every 6 (six) hours as needed for wheezing or shortness of breath. (Patient not taking: Reported on 06/23/2022) 1 each 1    EPINEPHrine (EPIPEN 2-PAK) 0.3 mg/0.3 mL IJ SOAJ injection Inject 0.3 mLs (0.3 mg total) into the muscle once as needed (for severe allergic reaction). CAll 911 immediately if  you have to use this medicine (Patient not taking: Reported on 05/31/2022) 2 Device 0    Prenatal MV-Min-FA-Omega-3 (PRENATAL GUMMIES/DHA & FA) 0.4-32.5 MG CHEW Chew 2 each by mouth daily. 60 tablet 9     Review of Systems Physical Exam   Blood pressure 111/61, pulse 65, temperature (!) 97.5 F (36.4 C), temperature source Oral, resp. rate 18, last menstrual period 11/08/2021, SpO2 99 %, currently breastfeeding.  Physical Exam Cardiovascular:     Rate and Rhythm: Normal rate and regular rhythm.     Pulses: Normal pulses.  Pulmonary:     Effort: Pulmonary effort is normal.     Breath sounds: Normal breath sounds.  Abdominal:     Palpations: Abdomen is soft.  Skin:    General: Skin is warm and dry.  Neurological:     Mental Status: She is alert.   GU: bulging bag at introitus, no bleeding, fetus cephalic, cervix complete   MAU Course  Procedures VBAC of G2P2002 at [redacted]w[redacted]d. Complicated by vaginal bleeding. Patient was given pitocin, IM  methergine, TXA, fundal massage. Patient had bilateral periurethral tears, hemostatic not requiring repair.   Assessment and Plan  Patient was in active labor, complete and ready to push/involuntarily pushing. Fetus with decels with contractions, encouraged mom to push and delivered baby without complications. Refer to delivery note for more details.   Lockie Mola 08/08/2022, 9:22 AM

## 2022-08-08 NOTE — MAU Note (Incomplete)
...  Phyllis Yates is a 21 y.o. at [redacted]w[redacted]d here in MAU reporting: EMS in code for CTX. EMS reported they could "feel a head." Dr. Nehemiah Settle, DO in ambulance bay awaiting arrival for patient with [name], RN.   Patient brought to room 120 immediately.  LMP: *** Onset of complaint: *** Pain score: *** There were no vitals filed for this visit.   FHT:*** Lab orders placed from triage:

## 2022-08-08 NOTE — H&P (Signed)
OBSTETRIC ADMISSION HISTORY AND PHYSICAL  Phyllis Yates is a 21 y.o. female G2P1001 with IUP at [redacted]w[redacted]d by LMP presenting in active labor, TOLAC. She reports +FMs, No LOF, no VB, no blurry vision, headaches or peripheral edema, and RUQ pain.  She plans on breast feeding. She is undecided for birth control. She received her prenatal care at [facility]   Dating: By LMP --->  Estimated Date of Delivery: 08/15/22  Sono:    @[redacted]w[redacted]d , CWD, normal anatomy, cephalic presentation, posterior lie, 2629g, 10% EFW   Prenatal History/Complications:   Tobacco use  H/o Csection for NRFHT Late to [facility] Asthma   Past Medical History: Past Medical History:  Diagnosis Date   Asthma    Medical history non-contributory     Past Surgical History: Past Surgical History:  Procedure Laterality Date   CESAREAN SECTION N/A 05/16/2019   Procedure: CESAREAN SECTION;  Surgeon: 05/18/2019, MD;  Location: [facility] LD [facility]S;  Service: Obstetrics;  Laterality: N/A;   NO PAST SURGERIES     TYMPANOSTOMY TUBE PLACEMENT      Obstetrical History: OB History     Gravida  2   Para  1   Term  1   Preterm      AB      Living  1      SAB      IAB      Ectopic      Multiple      Live Births              Social History Social History   Socioeconomic History   Marital status: Single    Spouse name: Not on file   Number of children: Not on file   Years of education: current 12th grade   Highest education level: 11th grade  Occupational History   Not on file  Tobacco Use   Smoking status: Never   Smokeless tobacco: Never  Vaping Use   Vaping Use: Never used  Substance and Sexual Activity   Alcohol use: No   Drug use: Yes    Types: Marijuana    Comment: last 13 of weed  Jun 12 2022   Sexual activity: Yes  Other Topics Concern   Not on file  Social History Narrative   Not on file   Social Determinants of Health   Financial Resource Strain: Low Risk  (12/18/2018)   Overall Financial  Resource Strain (CARDIA)    Difficulty of Paying Living Expenses: Not hard at all  Food Insecurity: No Food Insecurity (12/18/2018)   Hunger Vital Sign    Worried About Running Out of Food in the Last Year: Never true    Ran Out of Food in the Last Year: Never true  Transportation Needs: No Transportation Needs (12/18/2018)   PRAPARE - 12/20/2018 (Medical): No    Lack of Transportation (Non-Medical): No  Physical Activity: Unknown (12/18/2018)   Exercise Vital Sign    Days of Exercise per Week: 4 days    Minutes of Exercise per Session: Not on file  Stress: No Stress Concern Present (12/18/2018)   12/20/2018 of Occupational Health - Occupational Stress Questionnaire    Feeling of Stress : Not at all  Social Connections: Unknown (05/16/2019)   Social Connection and Isolation Panel [NHANES]    Frequency of Communication with Friends and Family: Once a week    Frequency of Social Gatherings with Friends and Family: Not on file  Attends Religious Services: Not on file    Active Member of Clubs or Organizations: Not on file    Attends Archivist Meetings: Not on file    Marital Status: Not on file    Family History: Family History  Problem Relation Age of Onset   Urticaria Father    Asthma Brother    Eczema Brother    Urticaria Paternal Aunt    Urticaria Paternal Grandfather     Allergies: No Known Allergies  Medications Prior to Admission  Medication Sig Dispense Refill Last Dose   albuterol (VENTOLIN HFA) 108 (90 Base) MCG/ACT inhaler Inhale 1-2 puffs into the lungs every 6 (six) hours as needed for wheezing or shortness of breath. (Patient not taking: Reported on 06/23/2022) 1 each 1    EPINEPHrine (EPIPEN 2-PAK) 0.3 mg/0.3 mL IJ SOAJ injection Inject 0.3 mLs (0.3 mg total) into the muscle once as needed (for severe allergic reaction). CAll 911 immediately if you have to use this medicine (Patient not taking: Reported on 05/31/2022) 2  Device 0    Prenatal MV-Min-FA-Omega-3 (PRENATAL GUMMIES/DHA & FA) 0.4-32.5 MG CHEW Chew 2 each by mouth daily. 60 tablet 9      Review of Systems   All systems reviewed and negative except as stated in HPI  Blood pressure 105/75, pulse 76, temperature 98.2 F (36.8 C), temperature source Oral, resp. rate 20, last menstrual period 11/08/2021, SpO2 99 %, currently breastfeeding. General appearance: alert and moderate distress very uncomfortable from contractions  Lungs: clear to auscultation bilaterally Heart: regular rate and rhythm Abdomen: soft, non-tender; bowel sounds normal Pelvic: bulging bag visible at introitus  Extremities: Homans sign is negative, no sign of DVT Presentation: cephalic Fetal monitoring Baseline 100, moderate variability, - accels, + decels with contractions  Uterine activity Contracting      Prenatal labs: ABO, Rh: B/RH(D) POSITIVE/-- (08/29 0000) Antibody: NO ANTIBODIES DETECTED (08/29 0000) Rubella: 3.58 (08/29 0000) RPR: Non Reactive (09/21 0914)  HBsAg: NON-REACTIVE (08/29 0000)  HIV: Non Reactive (09/21 0914)  GBS: Negative/-- (10/17 1131)  1 hr Glucola 89 Genetic screening  unknown, Panorama and Horizon requested  Anatomy US  wnl   Prenatal Transfer Tool  Maternal Diabetes: No Genetic Screening: unknown  Maternal Ultrasounds/Referrals: Normal Fetal Ultrasounds or other Referrals:  None Maternal Substance Abuse:  Yes:  Type: Smoker Significant Maternal Medications:  Meds include: Other: albuterol inhaler once every couple weeks  Significant Maternal Lab Results:  Group B Strep negative Number of Prenatal Visits:Less than or equal to 3 verified prenatal visits Other Comments:   TOLAC, hx of csection x1 for NRFHT  No results found for this or any previous visit (from the past 24 hour(s)).  Patient Active Problem List   Diagnosis Date Noted   Tobacco abuse 06/10/2022   Late prenatal care 06/10/2022   Supervision of other normal  pregnancy, antepartum 05/31/2022   Status post cesarean delivery 05/19/2019   Angioedema 06/11/2018   Mild intermittent asthma 06/11/2018    Assessment/Plan:  Phyllis Yates is a 21 y.o. G2P1001 at [redacted]w[redacted]d here in active labor, TOLAC.   #Labor:Complete with bulging bag at introitus, TOLAC, continue with delivery  #Pain: Unmedicated, maternally supported, IV access obtained after delivery fentanyl 100 mcg given   #FWB:decels with contractions, proceded to push and deliver, refer to delivery note  #ID:  GBS neg #MOF: Breast #MOC:undecided  #Circ:  N/a girl   Lowry Ram, MD  08/08/2022, 9:10 AM

## 2022-08-08 NOTE — Lactation Note (Addendum)
This note was copied from a baby's chart. Lactation Consultation Note  Patient Name: Phyllis Yates [id number] Date: 08/08/2022 Reason for consult: Follow-up assessment;Term Age:21 hours Mom stated BF going well having no issues. Mom denies painful latches. Easily expressed colostrum. Hear swallows. Praised mom. Experienced BF mom denies painful latches. Reviewed newborn feeding habits, behavior, STS, I&O, positioning, props, support, supply and demand. Mom encouraged to feed baby 8-12 times/24 hours and with feeding cues.  Asked mom if she was interested in pumping since baby wt. 6.5 lbs mom stated yes. DEBP set up. Mom pumped but nothing collected. Mom could easily express colostrum. Suggested she she does start collecting something to give it back to the baby d/t wt. Will drop until mom's milk comes in. Encouraged mom if she needs assistance or has questions or concerns to call. Maternal Data Has patient been taught Hand Expression?: Yes  Feeding    LATCH Score Latch: Grasps breast easily, tongue down, lips flanged, rhythmical sucking.  Audible Swallowing: Spontaneous and intermittent  Type of Nipple: Everted at rest and after stimulation  Comfort (Breast/Nipple): Soft / non-tender  Hold (Positioning): Assistance needed to correctly position infant at breast and maintain latch.  LATCH Score: 9   Lactation Tools Discussed/Used Tools: Pump;Flanges Flange Size: 21 Breast pump type: Double-Electric Breast Pump Pump Education: Setup, frequency, and cleaning;Milk Storage Reason for Pumping: baby 6.5 lbs Pumping frequency: q3h Pumped volume: 0 mL  Interventions Interventions: Breast feeding basics reviewed;Adjust position;DEBP;Assisted with latch;Support pillows;Skin to skin;Position options;Breast massage;Hand express;Breast compression;LC Services brochure  Discharge    Consult Status Consult Status: Follow-up Date: 08/09/22 Follow-up type: In-patient    Theodoro Kalata 08/08/2022, 9:15 PM

## 2022-08-08 NOTE — Lactation Note (Signed)
Lactation Consultation Note  Patient Name: Phyllis Yates [name] Date: 08/08/2022   Age:21 y.o.  Maternal Data    Feeding    LATCH Score                    Lactation Tools Discussed/Used    Interventions    Discharge    Consult Status Consult Status: Complete    Sonya Pucci G 08/08/2022, 8:03 PM

## 2022-08-09 LAB — CBC
HCT: 26.1 % — ABNORMAL LOW (ref 36.0–46.0)
Hemoglobin: 8.9 g/dL — ABNORMAL LOW (ref 12.0–15.0)
MCH: 29 pg (ref 26.0–34.0)
MCHC: 34.1 g/dL (ref 30.0–36.0)
MCV: 85 fL (ref 80.0–100.0)
Platelets: 215 10*3/uL (ref 150–400)
RBC: 3.07 MIL/uL — ABNORMAL LOW (ref 3.87–5.11)
RDW: 14.1 % (ref 11.5–15.5)
WBC: 12.4 10*3/uL — ABNORMAL HIGH (ref 4.0–10.5)
nRBC: 0 % (ref 0.0–0.2)

## 2022-08-09 LAB — BIRTH TISSUE RECOVERY COLLECTION (PLACENTA DONATION)

## 2022-08-09 MED ORDER — OXYCODONE-ACETAMINOPHEN 5-325 MG PO TABS: 2.0000 | ORAL_TABLET | Freq: Once | ORAL | Status: DC

## 2022-08-09 MED ORDER — SODIUM CHLORIDE 0.9 % IV SOLN
500.0000 mg | Freq: Once | INTRAVENOUS | Status: AC
  Administered 2022-08-09: 500 mg via INTRAVENOUS
  Filled 2022-08-09: qty 466.67

## 2022-08-09 NOTE — Clinical Social Work Maternal (Signed)
CLINICAL SOCIAL WORK MATERNAL/CHILD NOTE   Patient Details  Name: Phyllis Yates MRN: [id number] Date of Birth: 08/30/2001   Date:  08/09/2022   Clinical Social Worker Initiating Note:  Hicks Feick, LCSWA   Date/Time: Initiated:  08/09/22/2300              Child's Name:  Phyllis Yates    Biological Parents:  Mother, Father (Phyllis Yates 06/15/2001, Phyllis Yates 03/26/2000)    Need for Interpreter:  None    Reason for Referral:  Current Substance Use/Substance Use During Pregnancy      Address:  [address] [place] [address]    Phone number:  [phone] (home)      Additional phone number:    Household Members/Support Persons (HM/SP):   Household Member/Support Person 1, Household Member/Support Person 2     HM/SP Name Relationship DOB or Age  HM/SP -1 Phyllis Yates boyfriend 03/26/2000  HM/SP -2 Phyllis Yates son 05/16/2019  HM/SP -3     HM/SP -4     HM/SP -5     HM/SP -6     HM/SP -7     HM/SP -8         Natural Supports (not living in the home):  Parent    Professional Supports:      Employment: Unemployed    Type of Work:      Education:  High school graduate    Homebound arranged:     Financial Resources:  Medicaid    Other Resources:  Food Stamps      Cultural/Religious Considerations Which May Impact Care:     Strengths:  Ability to meet basic needs  , Home prepared for child  , Pediatrician chosen    Psychotropic Medications:          Pediatrician:    [place] area   Pediatrician List:    [place]   [place] [facility] ([address])  [place]   [place]   [place]   [place]       Pediatrician Fax Number:     Risk Factors/Current Problems:  Substance Use      Cognitive State:  Able to Concentrate  , Alert      Mood/Affect:  Calm  , Comfortable      CSW Assessment: CSW received consult for THC use during pregnancy CSW met with MOB to offer support  and complete assessment. CSW entered the room and observed MOB tending to the infants needs and FOB standing next to her. CSW introduced, self CSW role and reason for visit. MOB was agreeable to visit and allowed FOB to remain in the room during the assessment. CSW inquired about how MOB ws feeling MOB reported she was good and the delivery went well.    CSW inquired about MOB noted THC use, MOB reported she last used THC 4 months ago. CSW inquired about her reasoning, MOB reported no reason I just used occasionally. CSW explained the hospital drug screen policy and notified MOB if the CDS or UDS are positive for any substances a CPS report would be made. MOB voiced understanding. CSW inquired about any MH concerns. MOB reported none. CSW assessed for safety, MOB denied any SI or HI. MOB identified FOB and her mom as her supports. CSW provided education regarding the baby blues period vs. perinatal mood disorders, discussed treatment and gave resources for mental health follow up if concerns arise.    CSW recommends self-evaluation during the postpartum time period using the New Mom Checklist from Postpartum Progress and encouraged MOB to contact a medical professional if symptoms are noted at any time.     CSW provided review of Sudden Infant Death Syndrome (SIDS) precautions. MOB reported they have all necessary items for the infant including a bassinet and car seat.  CSW identifies no further need for intervention and no barriers to discharge at this time.   CSW Plan/Description:  No Further Intervention Required/No Barriers to Discharge, Sudden Infant Death Syndrome (SIDS) Education, Perinatal Mood and Anxiety Disorder (PMADs) Education, Hospital Drug Screen Policy Information, CSW Will Continue to Monitor Umbilical Cord Tissue Drug Screen Results and Make Report if Warranted      Zoya Sprecher M Deric Bocock, LCSW 08/09/2022, 2:18 PM 

## 2022-08-09 NOTE — Progress Notes (Signed)
Patient ID: Phyllis Yates, female   DOB: [birth year], 21 y.o.   MRN: [id number] POSTPARTUM PROGRESS NOTE  Post Partum Day 1  Subjective:  Phyllis Yates is a 21 y.o. [id number] s/p VBAC at [redacted]w[redacted]d.  No acute events overnight.  Pt denies problems with ambulating, voiding or po intake.  She denies nausea or vomiting.  Pain is well controlled.  She has had flatus. She has not had bowel movement.  Bleeding is light. Denies dizziness, shortness of breath, or dysuria.  Objective: Blood pressure 107/61, pulse 62, temperature 97.6 F (36.4 C), temperature source Oral, resp. rate 19, last menstrual period 11/08/2021, SpO2 99 %, currently breastfeeding.  Physical Exam:  General: alert, cooperative and no distress Chest: no respiratory distress Heart:regular rate, distal pulses intact Abdomen: soft, non tender Uterine Fundus: firm, appropriately tender DVT Evaluation: No calf swelling or tenderness Extremities: No edema Skin: warm, dry  Recent Labs    08/08/22 0925 08/09/22 0542  HGB 10.4* 8.9*  HCT 30.7* 26.1*    Assessment/Plan: Phyllis Yates is a 21 y.o. F7P1025 s/p VBAC at [redacted]w[redacted]d.  PPD#1 - Doing well Asymptomatic anemia - Hgb 8.6 today. Will give iron transfusion. Contraception: Nexplanon Feeding: Breastfeeding  Dispo: Patient would like to stay today.   LOS: 1 day   Irven [place], Medical Student, CNM 08/09/2022, 7:13 AM

## 2022-08-10 MED ORDER — ETONOGESTREL 68 MG ~~LOC~~ IMPL: 68.0000 mg | DRUG_IMPLANT | Freq: Once | SUBCUTANEOUS | Status: DC

## 2022-08-10 MED ORDER — LIDOCAINE HCL 1 % IJ SOLN: 0.0000 mL | Freq: Once | INTRAMUSCULAR | Status: DC | PRN

## 2022-08-10 MED ORDER — MEDROXYPROGESTERONE ACETATE 150 MG/ML IM SUSP
150.0000 mg | Freq: Once | INTRAMUSCULAR | Status: AC
  Administered 2022-08-10: 150 mg via INTRAMUSCULAR
  Filled 2022-08-10: qty 1

## 2022-08-10 NOTE — Lactation Note (Signed)
This note was copied from a baby's chart. Lactation Consultation Note  Patient Name: Phyllis Yates FYTWK'M Date: 08/10/2022 Reason for consult: Follow-up assessment;Term;Infant weight loss;Breastfeeding assistance Age:21 hours , mom exp BF of 2 years  Baby awake and hungry. LC assist to obtain the depth, swallows noted and per mom comfortable. Per mom milk is coming in and has a hand pump at home.  Latch score 8  LC reviewed BF D/C teaching and LC resources.   Maternal Data Has patient been taught Hand Expression?: Yes  Feeding Mother's Current Feeding Choice: Breast Milk  LATCH Score Latch: Grasps breast easily, tongue down, lips flanged, rhythmical sucking.  Audible Swallowing: Spontaneous and intermittent  Type of Nipple: Everted at rest and after stimulation  Comfort (Breast/Nipple): Filling, red/small blisters or bruises, mild/mod discomfort  Hold (Positioning): Assistance needed to correctly position infant at breast and maintain latch.  LATCH Score: 8   Lactation Tools Discussed/Used    Interventions Interventions: Breast feeding basics reviewed;Assisted with latch;Skin to skin;Breast massage;Hand express;Reverse pressure;Breast compression;Adjust position;Support pillows;Position options  Discharge Discharge Education: Engorgement and breast care;Warning signs for feeding baby Pump: Manual  Consult Status Consult Status: Complete Date: 08/10/22    Kathrin Greathouse 08/10/2022, 9:28 AM

## 2022-08-10 NOTE — Discharge Summary (Signed)
Postpartum Discharge Summary  Date of Service updated 08/10/22     Patient Name: Phyllis Yates DOB: 08/15/2001 MRN: [id number]  Date of admission: 08/08/2022 Delivery date:08/08/2022  Delivering provider: Truett Mainland  Date of discharge: 08/10/2022  Admitting diagnosis: Vaginal delivery following previous cesarean section, delivered [O34.219] Intrauterine pregnancy: [redacted]w[redacted]d    Secondary diagnosis:  Principal Problem:   Vaginal delivery following previous cesarean section, delivered  Additional problems: None    Discharge diagnosis: VBAC                                              Post partum procedures: none Augmentation: N/A Complications: None  Hospital course: Onset of Labor With Vaginal Delivery      21y.o. yo [id number] to the MAU in Active Labor on 08/08/2022. Labor course was uncomplicated. Patient delivered in the MAU. She had bleeding requiring TXA and IM methergine. Bleeding resolved and did not result in post partum hemorrhage.  Membrane Rupture Time/Date: 8:32 AM ,08/08/2022   Delivery Method:VBAC, Spontaneous  Episiotomy: None  Lacerations:  None  Patient had a postpartum course that was complicated by anemia with hemoglobin of 8.9. She received a transfusion of venofer. She was otherwise hemodynamically stable and asymptomatic.  She is ambulating, tolerating a regular diet, passing flatus, and urinating well. Patient is discharged home in stable condition on 08/10/22.  Newborn Data: Birth date:08/08/2022  Birth time:8:32 AM  Gender:Female  Living status:Living  Apgars:8 ,9  Weight:2886 g   Magnesium Sulfate received: No BMZ received: No Rhophylac:N/A MMR:N/A T-DaP:Given postpartum Flu: Yes Transfusion:No  Physical exam  Vitals:   08/09/22 0423 08/09/22 1431 08/09/22 2149 08/10/22 0645  BP: 107/61 108/66 118/80 110/68  Pulse: 62 70 (!) 59 61  Resp: _0 Temp: 97.6 F (36.4 C)  98.5 F (36.9 C) 98.8 F (37.1 C)  TempSrc:  Oral  Oral Oral  SpO2: 99%  100% 100%   General: alert, cooperative, and no distress Lochia: appropriate Uterine Fundus: firm Incision: N/A DVT Evaluation: No evidence of DVT seen on physical exam. No significant calf/ankle edema. Labs: Lab Results  Component Value Date   WBC 12.4 (H) 08/09/2022   HGB 8.9 (L) 08/09/2022   HCT 26.1 (L) 08/09/2022   MCV 85.0 08/09/2022   PLT 215 08/09/2022      Latest Ref Rng & Units 05/16/2019    1:36 PM  CMP  Glucose 70 - 99 mg/dL 113   BUN 4 - 18 mg/dL <5   Creatinine 0.50 - 1.00 mg/dL 0.67   Sodium 135 - 145 mmol/L 136   Potassium 3.5 - 5.1 mmol/L 3.6   Chloride 98 - 111 mmol/L 106   CO2 22 - 32 mmol/L 23   Calcium 8.9 - 10.3 mg/dL 9.2   Total Protein 6.5 - 8.1 g/dL 6.2   Total Bilirubin 0.3 - 1.2 mg/dL 0.5   Alkaline Phos 47 - 119 U/L 131   AST 15 - 41 U/L 17   ALT 0 - 44 U/L 10    Edinburgh Score:    08/14/2019   10:05 AM  Edinburgh Postnatal Depression Scale Screening Tool  I have been able to laugh and see the funny side of things. 2  I have looked forward with enjoyment to things. 0  I have blamed myself unnecessarily when  things went wrong. 2  I have been anxious or worried for no good reason. 0  I have felt scared or panicky for no good reason. 2  Things have been getting on top of me. 2  I have been so unhappy that I have had difficulty sleeping. 1  I have felt sad or miserable. 1  I have been so unhappy that I have been crying. 1  The thought of harming myself has occurred to me. 0  Edinburgh Postnatal Depression Scale Total 11     After visit meds:  Allergies as of 08/10/2022   No Known Allergies      Medication List     TAKE these medications    acetaminophen 500 MG tablet Commonly known as: TYLENOL Take 1,000 mg by mouth 2 (two) times daily as needed (tooth pain).   FERROUS SULFATE PO Take 5 mLs by mouth daily. Ferrous sulfate liquid   ibuprofen 200 MG tablet Commonly known as: ADVIL Take 400 mg  by mouth daily as needed (tooth pain).   Prenatal Gummies/DHA & FA 0.4-32.5 MG Chew Chew 2 each by mouth daily.   PRENATAL PO Take 1 tablet by mouth daily.         Discharge home in stable condition Infant Feeding: Breast Infant Disposition:home with mother Discharge instruction: per After Visit Summary and Postpartum booklet. Activity: Advance as tolerated. Pelvic rest for 6 weeks.  Diet: routine diet Future Appointments: Future Appointments  Date Time Provider [place]  09/13/2022 10:55 AM Gavin Pound, CNM [facility] None   Follow up Visit:   Please schedule this patient for a In person postpartum visit in 4 weeks with the following provider: Any provider. Additional Postpartum F/U: N/A    Low risk pregnancy uncomplicated Delivery mode:  VBAC, Spontaneous  Anticipated Birth Control: Depo   Message sent on 08/10/2022 to [place]   08/10/2022 Jacquiline Doe, CNM

## 2022-08-11 ENCOUNTER — Encounter: Payer: Medicaid Other | Admitting: Family Medicine

## 2022-08-12 ENCOUNTER — Encounter (HOSPITAL_COMMUNITY): Payer: Self-pay | Admitting: Obstetrics and Gynecology

## 2022-08-12 ENCOUNTER — Inpatient Hospital Stay (HOSPITAL_COMMUNITY): Payer: Medicaid Other

## 2022-08-12 ENCOUNTER — Encounter: Payer: Self-pay | Admitting: Obstetrics and Gynecology

## 2022-08-12 ENCOUNTER — Other Ambulatory Visit: Payer: Self-pay

## 2022-08-12 ENCOUNTER — Inpatient Hospital Stay (HOSPITAL_COMMUNITY)
Admission: AD | Admit: 2022-08-12 | Discharge: 2022-08-12 | Disposition: A | Discharge status: Home / Self Care | Payer: Medicaid Other | Attending: Obstetrics and Gynecology | Admitting: Obstetrics and Gynecology

## 2022-08-12 DIAGNOSIS — O99893 Other specified diseases and conditions complicating puerperium: Secondary | ICD-10-CM | POA: Diagnosis not present

## 2022-08-12 DIAGNOSIS — N939 Abnormal uterine and vaginal bleeding, unspecified: Secondary | ICD-10-CM | POA: Diagnosis present

## 2022-08-12 DIAGNOSIS — D649 Anemia, unspecified: Secondary | ICD-10-CM | POA: Insufficient documentation

## 2022-08-12 DIAGNOSIS — O9089 Other complications of the puerperium, not elsewhere classified: Secondary | ICD-10-CM | POA: Diagnosis not present

## 2022-08-12 DIAGNOSIS — O99325 Drug use complicating the puerperium: Secondary | ICD-10-CM | POA: Diagnosis not present

## 2022-08-12 DIAGNOSIS — M549 Dorsalgia, unspecified: Secondary | ICD-10-CM | POA: Insufficient documentation

## 2022-08-12 DIAGNOSIS — N719 Inflammatory disease of uterus, unspecified: Secondary | ICD-10-CM

## 2022-08-12 LAB — CBC
HCT: 25.1 % — ABNORMAL LOW (ref 36.0–46.0)
Hemoglobin: 8.2 g/dL — ABNORMAL LOW (ref 12.0–15.0)
MCH: 28.7 pg (ref 26.0–34.0)
MCHC: 32.7 g/dL (ref 30.0–36.0)
MCV: 87.8 fL (ref 80.0–100.0)
Platelets: 259 10*3/uL (ref 150–400)
RBC: 2.86 MIL/uL — ABNORMAL LOW (ref 3.87–5.11)
RDW: 14.6 % (ref 11.5–15.5)
WBC: 9 10*3/uL (ref 4.0–10.5)
nRBC: 0 % (ref 0.0–0.2)

## 2022-08-12 MED ORDER — MISOPROSTOL 200 MCG PO TABS: 400.0000 ug | ORAL_TABLET | Freq: Three times a day (TID) | ORAL | 0 refills | Status: DC

## 2022-08-12 MED ORDER — IBUPROFEN 600 MG PO TABS
600.0000 mg | ORAL_TABLET | Freq: Once | ORAL | Status: AC
  Administered 2022-08-12: 600 mg via ORAL
  Filled 2022-08-12: qty 1

## 2022-08-12 MED ORDER — MISOPROSTOL 200 MCG PO TABS: 800.0000 ug | ORAL_TABLET | Freq: Once | ORAL | Status: DC

## 2022-08-12 MED ORDER — AMOXICILLIN-POT CLAVULANATE 500-125 MG PO TABS: 1.0000 | ORAL_TABLET | Freq: Three times a day (TID) | ORAL | 0 refills | Status: DC

## 2022-08-12 MED ORDER — IBUPROFEN 600 MG PO TABS: 600.0000 mg | ORAL_TABLET | Freq: Four times a day (QID) | ORAL | 0 refills | Status: AC | PRN

## 2022-08-12 MED ORDER — OXYCODONE-ACETAMINOPHEN 5-325 MG PO TABS: 1.0000 | ORAL_TABLET | Freq: Four times a day (QID) | ORAL | 0 refills | Status: DC | PRN

## 2022-08-12 NOTE — MAU Provider Note (Signed)
History     CSN: [id number]  Arrival date and time: 08/12/22 1651   Event Date/Time   First Provider Initiated Contact with Patient 08/12/22 1721      Chief Complaint  Patient presents with   Vaginal Bleeding   Phyllis Yates is a 21 yo G2P2002 at PPD#4 who presents to the MAU via EMS for vaginal bleeding. Bleeding post partum was managed with TXA and methergine. She had a Hgb of 8.9 on 08/09/22 and was treated with a venofer transfusion. She was discharged on 08/10/22. The patient reports that she has been passing small clots since her discharge. She was using 2 pads per day. The patient reports that just prior to her calling EMS she noted a larger clot "hanging on." She notes that she passed the clot once she arrived in the MAU. She denies any fatigue, dizziness, or light headedness. She denies any trauma. Denies recent intercourse. The patient complains of intermittent abdominal and back pain. She describes the pain as cramping and rates it a 2/10 today. The pain worsens with walking and while breast feeding. Denies fever or chills.   Vaginal Bleeding Associated symptoms include abdominal pain and back pain.    OB History     Gravida  2   Para  2   Term  2   Preterm      AB      Living  2      SAB      IAB      Ectopic      Multiple  0   Live Births  1           Past Medical History:  Diagnosis Date   Asthma    Medical history non-contributory     Past Surgical History:  Procedure Laterality Date   CESAREAN SECTION N/A 05/16/2019   Procedure: CESAREAN SECTION;  Surgeon: Hermina Staggers, MD;  Location: [facility];  Service: Obstetrics;  Laterality: N/A;   TYMPANOSTOMY TUBE PLACEMENT      Family History  Problem Relation Age of Onset   Urticaria Father    Asthma Brother    Eczema Brother    Urticaria Paternal Aunt    Urticaria Paternal Grandfather     Social History   Tobacco Use   Smoking status: Never   Smokeless tobacco: Never  Vaping Use    Vaping Use: Never used  Substance Use Topics   Alcohol use: No   Drug use: Yes    Types: Marijuana    Comment: last [place] of weed  Jun 12 2022    Allergies: No Known Allergies  Medications Prior to Admission  Medication Sig Dispense Refill Last Dose   ibuprofen (ADVIL) 200 MG tablet Take 400 mg by mouth daily as needed (tooth pain).   08/11/2022   acetaminophen (TYLENOL) 500 MG tablet Take 1,000 mg by mouth 2 (two) times daily as needed (tooth pain).      FERROUS SULFATE PO Take 5 mLs by mouth daily. Ferrous sulfate liquid      Prenatal MV-Min-FA-Omega-3 (PRENATAL GUMMIES/DHA & FA) 0.4-32.5 MG CHEW Chew 2 each by mouth daily. (Patient not taking: Reported on 08/08/2022) 60 tablet 9    Prenatal Vit-Fe Fumarate-FA (PRENATAL PO) Take 1 tablet by mouth daily.   08/10/2022    Review of Systems  Constitutional: Negative.   HENT: Negative.    Respiratory: Negative.    Cardiovascular: Negative.   Gastrointestinal:  Positive for abdominal pain.  Endocrine: Negative.  Genitourinary:  Positive for vaginal bleeding.  Musculoskeletal:  Positive for back pain.  Neurological: Negative.  Negative for dizziness, syncope, weakness and light-headedness.   Physical Exam   Blood pressure 128/81, pulse 61, temperature 98.6 F (37 C), temperature source Oral, resp. rate 18, SpO2 100 %, unknown if currently breastfeeding.  Physical Exam Constitutional:      General: She is not in acute distress.    Appearance: Normal appearance.  HENT:     Head: Atraumatic.  Eyes:     Extraocular Movements: Extraocular movements intact.  Cardiovascular:     Rate and Rhythm: Normal rate and regular rhythm.  Pulmonary:     Effort: Pulmonary effort is normal.  Abdominal:     Tenderness: There is abdominal tenderness. There is no guarding or rebound.  Musculoskeletal:     Cervical back: Normal range of motion.  Neurological:     Mental Status: She is alert.     MAU Course  Procedures Results for orders  placed or performed during the hospital encounter of 08/12/22 (from the past 24 hour(s))  CBC     Status: Abnormal   Collection Time: 08/12/22  5:57 PM  Result Value Ref Range   WBC 9.0 4.0 - 10.5 K/uL   RBC 2.86 (L) 3.87 - 5.11 MIL/uL   Hemoglobin 8.2 (L) 12.0 - 15.0 g/dL   HCT 50.9 (L) 32.6 - 71.2 %   MCV 87.8 80.0 - 100.0 fL   MCH 28.7 26.0 - 34.0 pg   MCHC 32.7 30.0 - 36.0 g/dL   RDW 45.8 09.9 - 83.3 %   Platelets 259 150 - 400 K/uL   nRBC 0.0 0.0 - 0.2 %    US Pelvis Limited IMPRESSION: 1. Heterogenous mixed echogenicity area/ "mass" within the lower uterine segment measuring 2.5 x 1.4 x 2.6 cm. Findings could be compatible with retained products of conception in the appropriate clinical setting. 2. Enlarged uterus consistent with recent postpartum status.  MDM CBC ordered today due to vaginal bleeding and history of anemia. Hgb 8.2 today. US pelvis today showing Heterogenous mixed echogenicity area/ "mass" within the lower uterine segment measuring 2.5 x 1.4 x 2.6 cm. Cytotec 800 mcg given PO in the MAU.   Assessment and Plan  Vaginal Bleeding  - CBC and US pelvis completed today   2. Subinvolution/endometritis  - Cytotec 800 mcg PO in the MAU - Rx for Cytotec 400 mcg TID x3 days - Rx for Augmentin 500mg  TID x10 days    3. Anemia  - Hgb 8.2 today  - Continue oral iron    -Discharge home in stable condition -Vaginal bleeding post partum precautions discussed -Patient advised to follow-up with [facility] in one week. -Patient may return to MAU as needed or if her condition were to change or worsen   [facility] 08/12/2022, 5:29 PM

## 2022-08-12 NOTE — MAU Note (Signed)
Phyllis Yates is a 21 y.o. at Unknown here in MAU via EMS reporting heavy VB that began today.  Reports 1 large blood, the rest have been small.  Denies saturating through sanitary napkins.  Endorses intermittent abdominal cramping. S/P NSVD 08/08/2022 LMP: N/A Onset of complaint: today Pain score: 2 Vitals:   08/12/22 1702  BP: 122/88  Pulse: 64  Resp: 18  Temp: 98.6 F (37 C)  SpO2: 100%     FHT:N/A Lab orders placed from triage:   None

## 2022-08-12 NOTE — Progress Notes (Signed)
Fundus firm 3-4 below umbilicus.  Scant VB noted on peripad.

## 2022-08-12 NOTE — MAU Note (Signed)
Pt reports no additional clots or heaving vaginal bleeding-denies uterine cramping. Reports her tooth has started hurting and she requests Ibuprofen for pain.

## 2022-08-12 NOTE — Progress Notes (Signed)
  I was called to interpret the sonogram It shows essentially normal changes in the very recent postpartum period with heterogenous changes consistent with decidualized endometrium No focal blood flow argues against retained placental fragment(s) Generally with the history of heavy bleeding 4 days postpartum I assume likely low grade endometritis as probable, even with normal WBC  Recommend 800 cytotec in MAU and 3 days of 400 mcg TID x 3 d + augmentin x 10 days  Lazaro Arms, MD 08/12/2022 7:41 PM

## 2022-08-12 NOTE — MAU Provider Note (Signed)
History     CSN: [id number]  Arrival date and time: 08/12/22 1651   Event Date/Time   First Provider Initiated Contact with Patient 08/12/22 1721      Chief Complaint  Patient presents with   Vaginal Bleeding   HPI Phyllis Yates is a 21 y.o. year old G60P2002 female at 4 days PP who presents to MAU via EMS reporting passing small blood clots since delivery. She reports that 30 mins prior to her arrival to MAU, a large blood clot came out of her vagina, but "just hung on." She got concerned because she had increased VB after delivery; which required TXA, Methergine and a Venofer infusion. She receives [facility] with [facility]; next appt is 09/13/2022.   OB History     Gravida  2   Para  2   Term  2   Preterm      AB      Living  2      SAB      IAB      Ectopic      Multiple  0   Live Births  1           Past Medical History:  Diagnosis Date   Asthma    Medical history non-contributory     Past Surgical History:  Procedure Laterality Date   CESAREAN SECTION N/A 05/16/2019   Procedure: CESAREAN SECTION;  Surgeon: Hermina Staggers, MD;  Location: [facility] LD [facility]S;  Service: Obstetrics;  Laterality: N/A;   TYMPANOSTOMY TUBE PLACEMENT      Family History  Problem Relation Age of Onset   Urticaria Father    Asthma Brother    Eczema Brother    Urticaria Paternal Aunt    Urticaria Paternal Grandfather     Social History   Tobacco Use   Smoking status: Never   Smokeless tobacco: Never  Vaping Use   Vaping Use: Never used  Substance Use Topics   Alcohol use: No   Drug use: Yes    Types: Marijuana    Comment: last [place] of weed  Jun 12 2022    Allergies: No Known Allergies  Medications Prior to Admission  Medication Sig Dispense Refill Last Dose   ibuprofen (ADVIL) 200 MG tablet Take 400 mg by mouth daily as needed (tooth pain).   08/11/2022   acetaminophen (TYLENOL) 500 MG tablet Take 1,000 mg by mouth 2 (two) times daily as needed (tooth pain).       FERROUS SULFATE PO Take 5 mLs by mouth daily. Ferrous sulfate liquid      Prenatal MV-Min-FA-Omega-3 (PRENATAL GUMMIES/DHA & FA) 0.4-32.5 MG CHEW Chew 2 each by mouth daily. (Patient not taking: Reported on 08/08/2022) 60 tablet 9    Prenatal Vit-Fe Fumarate-FA (PRENATAL PO) Take 1 tablet by mouth daily.   08/10/2022    Review of Systems  Constitutional: Negative.   HENT: Negative.    Eyes: Negative.   Respiratory: Negative.    Cardiovascular: Negative.   Gastrointestinal: Negative.   Endocrine: Negative.   Genitourinary:  Positive for vaginal bleeding (passing small clots until this afternoon when a larger clot tried to pass, but just "hung on").  Musculoskeletal: Negative.   Skin: Negative.   Allergic/Immunologic: Negative.   Neurological: Negative.   Hematological: Negative.   Psychiatric/Behavioral: Negative.     Physical Exam   Blood pressure 128/81, pulse 61, temperature 98.6 F (37 C), temperature source Oral, resp. rate 18, SpO2 100 %, unknown if currently breastfeeding.  Physical Exam Vitals and nursing note reviewed.  Constitutional:      Appearance: Normal appearance. She is normal weight.  Cardiovascular:     Rate and Rhythm: Normal rate.  Pulmonary:     Effort: Pulmonary effort is normal.  Genitourinary:    General: Normal vulva.  Musculoskeletal:        General: Normal range of motion.  Skin:    General: Skin is warm and dry.  Neurological:     Mental Status: She is alert and oriented to person, place, and time.  Psychiatric:        Mood and Affect: Mood normal.        Behavior: Behavior normal.        Thought Content: Thought content normal.        Judgment: Judgment normal.  Reassessment @ 1928: c/o toothache on the RT side of mouth and would like Ibuprofen to take for the pain. No U/S results at this time. MAU Course  Procedures  MDM CBC Pelvic Limited U/S Results for orders placed or performed during the hospital encounter of 08/12/22 (from the  past 24 hour(s))  CBC     Status: Abnormal   Collection Time: 08/12/22  5:57 PM  Result Value Ref Range   WBC 9.0 4.0 - 10.5 K/uL   RBC 2.86 (L) 3.87 - 5.11 MIL/uL   Hemoglobin 8.2 (L) 12.0 - 15.0 g/dL   HCT 87.5 (L) 64.3 - 32.9 %   MCV 87.8 80.0 - 100.0 fL   MCH 28.7 26.0 - 34.0 pg   MCHC 32.7 30.0 - 36.0 g/dL   RDW 51.8 84.1 - 66.0 %   Platelets 259 150 - 400 K/uL   nRBC 0.0 0.0 - 0.2 %     US Pelvis Limited  Result Date: 08/12/2022 CLINICAL DATA:  Vaginal bleeding EXAM: TRANSABDOMINAL ULTRASOUND OF PELVIS TECHNIQUE: Transabdominal ultrasound examination of the pelvis was performed including evaluation of the uterus, ovaries, adnexal regions, and pelvic cul-de-sac. COMPARISON:  None Available. FINDINGS: Uterus Measurements: 13.4 x 8.3 x 12.4 cm = volume: 716.6 mL. No fibroids or other mass visualized. Endometrium Thickness: 8.3 cm. Heterogenous mixed echogenicity area within the lower uterine segment measuring 2.5 x 1.4 x 2.6 cm. This is not significantly vascular Right ovary Measurements: 4.1 x 2 x 2.2 cm = volume: 9.4 mL. Normal appearance/no adnexal mass. Left ovary Measurements: 2.3 x 2.1 x 3.1 cm = volume: 7.5 mL. Normal appearance/no adnexal mass. Other findings:  No abnormal free fluid. IMPRESSION: 1. Heterogenous mixed echogenicity area/ "mass" within the lower uterine segment measuring 2.5 x 1.4 x 2.6 cm. Findings could be compatible with retained products of conception in the appropriate clinical setting. 2. Enlarged uterus consistent with recent postpartum status. Electronically Signed   By: Jasmine Pang M.D.   On: 08/12/2022 19:13     Assessment and Plan  1. Endometritis - Information provided on endometritis - Rx: Cytotec 400 mcg TID x 3 days - Rx: Augmentin 500/125 mg TID x 10 days - Rx: Percocet 5/325 mg x 3 days - Rx: Ibuprofen 600 mg TID po   2. Subinvolution of uterus in puerperium  - Discharge patient    Phyllis Yates, CNM 08/12/2022, 5:21 PM

## 2022-08-16 ENCOUNTER — Ambulatory Visit: Payer: Medicaid Other

## 2022-08-17 ENCOUNTER — Telehealth (HOSPITAL_COMMUNITY): Payer: Self-pay

## 2022-08-17 NOTE — Telephone Encounter (Signed)
No answer. No voicemail option available.  Phyllis Yates [facility] 08/17/22,1900

## 2022-08-18 ENCOUNTER — Encounter: Payer: Medicaid Other | Admitting: Family Medicine

## 2022-09-13 ENCOUNTER — Ambulatory Visit (INDEPENDENT_AMBULATORY_CARE_PROVIDER_SITE_OTHER): Payer: Medicaid Other

## 2022-09-13 DIAGNOSIS — Z5941 Food insecurity: Secondary | ICD-10-CM | POA: Diagnosis not present

## 2022-09-13 DIAGNOSIS — Z720 Tobacco use: Secondary | ICD-10-CM | POA: Diagnosis not present

## 2022-09-13 DIAGNOSIS — F53 Postpartum depression: Secondary | ICD-10-CM | POA: Diagnosis not present

## 2022-09-13 NOTE — Progress Notes (Signed)
Post Partum Visit Note  Phyllis Yates is a 21 y.o. G34P2002 female who presents for a postpartum visit. She is 5 weeks postpartum following a VBAC delivery.  I have fully reviewed the prenatal and intrapartum course. The delivery was at 39 gestational weeks.  Anesthesia: none. Postpartum course has been uncomplicated. Baby is doing well. Baby is feeding by breast. Bleeding red. Bowel function is normal. Bladder function is normal. Patient is not sexually active. Contraception method is Depo-Provera injections. Postpartum depression screening: negative.   The pregnancy intention screening data noted above was reviewed. Potential methods of contraception were discussed. The patient elected to proceed with No data recorded.  She reports breastfeeding exclusively, but started having bleeding today.   She reports taking Depo. She denies sexual activity.  She reports some mild depression and receives help, with infant care, from her mother, BF, and sister.  She states she has resumed smoking of cigars and is using ~ 1 per day.  She further reports having difficulty securing food in her household as sometimes food is not available despite assistance from her mother.   Health Maintenance Due  Topic Date Due   COVID-19 Vaccine (1) Never done   HPV VACCINES (1 - 2-dose series) Never done   PAP-Cervical Cytology Screening  Never done   PAP SMEAR-Modifier  Never done    The following portions of the patient's history were reviewed and updated as appropriate: allergies, current medications, past family history, past medical history, past social history, past surgical history, and problem list.  Review of Systems Pertinent items are noted in HPI.  Objective:  There were no vitals taken for this visit.   General:  alert, cooperative, and no distress   Breasts:  normal  Lungs: clear to auscultation bilaterally  Heart:  regular rate and rhythm  Abdomen: normal findings: bowel sounds normal,  no organomegaly, and soft, non-tender   Wound N/A  GU exam:  not indicated       Assessment:   4 week postpartum exam.  BreastFeeding Tobacco Use VBAC Food Insecurity Mild PPD  Plan:   -Okay to return to normal activities as tolerated. -Patient reports some days without food d/t running out prior to next pay. -States mother sometimes is able to help, but not always. -Pregnancy care coordinate-Kim-notified of patient hardship and will provide support -Discussed utilizing transportation services, through office, for transport to and from appts.  -Encouraged to call 2-3 days before scheduled appts to coordinate.  -Patient reports mild depression, but no thoughts of self-harm.  Will send in referral for [facility]. -Reports returning to smoking cigars. Expresses desire for cessation.  Reports usage of nicotine patches successful in the past. Rx sent to pharmacy on file.  -Patient due for pap smear today, but declines.  Will plan to complete at next appt.   Essential components of care per ACOG recommendations:  1.  Mood and well being: Patient with negative depression screening today. Reviewed local resources for support.  - Patient tobacco use? Yes. Patient desires to quit? Yes.Discussed reduction and cessation Reports having patch in past with good results. Desires prescription.  - hx of drug use? No.    2. Infant care and feeding:  -Patient currently breastmilk feeding? Yes. Reviewed importance of draining breast regularly to support lactation.  -Social determinants of health (SDOH) reviewed in EPIC. The following needs were identified-Food insecurity and Transportation  3. Sexuality, contraception and birth spacing - Patient does not want a pregnancy in the  next year.  Desired family size is 2 children.  - Reviewed reproductive life planning. Reviewed contraceptive methods based on pt preferences and effectiveness.  Patient desired Hormonal Injection today.   - Discussed birth spacing  of 18 months  4. Sleep and fatigue -Encouraged family/partner/community support of 4 hrs of uninterrupted sleep to help with mood and fatigue  5. Physical Recovery  - Discussed patients delivery and complications. She describes her labor as good. - Patient had a Vaginal, no problems at delivery. Patient had a  no  laceration. Perineal healing reviewed. Patient expressed understanding - Patient has urinary incontinence? No. - Patient is safe to resume physical and sexual activity  6.  Health Maintenance - HM due items addressed Yes - Last pap smear No results found for: "DIAGPAP" Pap smear not done at today's visit. Patient declines -Breast Cancer screening indicated? No.   7. Chronic Disease/Pregnancy Condition follow up: None  - PCP follow up  Cherre Robins, CNM [facility] [employer]

## 2022-09-14 MED ORDER — NICOTINE 21 MG/24HR TD PT24: 21.0000 mg | MEDICATED_PATCH | Freq: Every day | TRANSDERMAL | 1 refills | Status: AC

## 2022-09-24 ENCOUNTER — Other Ambulatory Visit: Payer: Self-pay

## 2022-09-24 ENCOUNTER — Encounter (HOSPITAL_BASED_OUTPATIENT_CLINIC_OR_DEPARTMENT_OTHER): Payer: Self-pay | Admitting: Emergency Medicine

## 2022-09-24 ENCOUNTER — Emergency Department (HOSPITAL_BASED_OUTPATIENT_CLINIC_OR_DEPARTMENT_OTHER)
Admission: EM | Admit: 2022-09-24 | Discharge: 2022-09-24 | Disposition: A | Discharge status: Home / Self Care | Payer: Medicaid Other | Attending: Emergency Medicine | Admitting: Emergency Medicine

## 2022-09-24 DIAGNOSIS — N939 Abnormal uterine and vaginal bleeding, unspecified: Secondary | ICD-10-CM | POA: Insufficient documentation

## 2022-09-24 DIAGNOSIS — Z711 Person with feared health complaint in whom no diagnosis is made: Secondary | ICD-10-CM | POA: Diagnosis not present

## 2022-09-24 LAB — URINALYSIS, MICROSCOPIC (REFLEX)

## 2022-09-24 LAB — URINALYSIS, ROUTINE W REFLEX MICROSCOPIC
Bilirubin Urine: NEGATIVE
Glucose, UA: NEGATIVE mg/dL
Ketones, ur: NEGATIVE mg/dL
Nitrite: NEGATIVE
Protein, ur: NEGATIVE mg/dL
Specific Gravity, Urine: 1.025 (ref 1.005–1.030)
pH: 6 (ref 5.0–8.0)

## 2022-09-24 NOTE — ED Triage Notes (Signed)
Patient reports she is concerned she has a prolapsed uterus, states when she sits down it is uncomfortable and looked at a picture on google and states it looks the same.

## 2022-09-24 NOTE — Discharge Instructions (Signed)
You have been evaluated for your symptoms.  Fortunately no evidence of uterine prolapse.  Please follow-up closely with your OB/GYN as previously scheduled and you may discuss further concerns with your OB.

## 2022-09-24 NOTE — ED Notes (Signed)
D/c paperwork reviewed with pt, including f/u care. Pt verbalized understanding, no questions or concerns at time of d/c. Ambulatory to ED exit without assistance.  

## 2022-09-24 NOTE — ED Provider Notes (Signed)
[facility] EMERGENCY DEPARTMENT Provider Note   CSN: [id number] Arrival date & time: 09/24/22  1435     History  Chief Complaint  Patient presents with   Vaginal Bleeding    Phyllis Yates is a 21 y.o. female.  The history is provided by the patient and medical records. No language interpreter was used.  Vaginal Bleeding    21 year old female presenting with complaints of concerns for uterine prolapse.  Patient reports she recently gave birth a month ago.  It was a normal vaginal delivery.  3 weeks ago while squatting down when she felt something pushed out from her vagina and noticed a "ball" mass that protrudes out from her vagina which concerns her.  As she was googling and thought that it could be a uterine prolapse.  She would like to be evaluated for that.  She does not endorse any significant discomfort.  She has not had menstruation since recent delivery.  She does not have any other symptoms such as abdominal cramping, dysuria, vaginal bleeding or vaginal discharge.  Home Medications Prior to Admission medications   Medication Sig Start Date End Date Taking? Authorizing Provider  acetaminophen (TYLENOL) 500 MG tablet Take 1,000 mg by mouth 2 (two) times daily as needed (tooth pain). Patient not taking: Reported on 09/13/2022    [provider]  amoxicillin-clavulanate (AUGMENTIN) 500-125 MG tablet Take 1 tablet by mouth 3 (three) times daily. Patient not taking: Reported on 09/13/2022 08/12/22   [name], CNM  FERROUS SULFATE PO Take 5 mLs by mouth daily. Ferrous sulfate liquid Patient not taking: Reported on 09/13/2022    [provider]  ibuprofen (ADVIL) 200 MG tablet Take 400 mg by mouth daily as needed (tooth pain). Patient not taking: Reported on 09/13/2022    [provider]  ibuprofen (ADVIL) 600 MG tablet Take 1 tablet (600 mg total) by mouth every 6 (six) hours as needed. 08/12/22   [name], CNM   misoprostol (CYTOTEC) 200 MCG tablet Take 2 tablets (400 mcg total) by mouth 3 (three) times daily. Patient not taking: Reported on 09/13/2022 08/12/22   [name], CNM  nicotine (NICODERM CQ - DOSED IN MG/24 HOURS) 21 mg/24hr patch Place 1 patch (21 mg total) onto the skin daily. 09/14/22   Gerrit Heck, CNM  oxyCODONE-acetaminophen (PERCOCET/ROXICET) 5-325 MG tablet Take 1 tablet by mouth every 6 (six) hours as needed for severe pain. Patient not taking: Reported on 09/13/2022 08/12/22   [name], CNM  Prenatal MV-Min-FA-Omega-3 (PRENATAL GUMMIES/DHA & FA) 0.4-32.5 MG CHEW Chew 2 each by mouth daily. Patient not taking: Reported on 08/08/2022 05/31/22   Rasch, Victorino Dike I, NP  Prenatal Vit-Fe Fumarate-FA (PRENATAL PO) Take 1 tablet by mouth daily. Patient not taking: Reported on 09/13/2022    [provider]      Allergies    Patient has no known allergies.    Review of Systems   Review of Systems  Genitourinary:  Positive for vaginal bleeding.  All other systems reviewed and are negative.   Physical Exam Updated Vital Signs BP 132/84 (BP Location: Right Arm)   Pulse 79   Temp 98.3 F (36.8 C) (Oral)   Resp 18   Ht 5\' 6"  (1.676 m)   Wt 74.8 kg   SpO2 98%   Breastfeeding Yes   BMI 26.63 kg/m  Physical Exam Vitals and nursing note reviewed.  Constitutional:      General: She is not in acute distress.  Appearance: She is well-developed.  HENT:     Head: Atraumatic.  Eyes:     Conjunctiva/sclera: Conjunctivae normal.  Pulmonary:     Effort: Pulmonary effort is normal.  Musculoskeletal:     Cervical back: Neck supple.  Skin:    Findings: No rash.  Neurological:     Mental Status: She is alert.  Psychiatric:        Mood and Affect: Mood normal.     ED Results / Procedures / Treatments   Labs (all labs ordered are listed, but only abnormal results are displayed) Labs Reviewed  URINALYSIS, ROUTINE W REFLEX MICROSCOPIC - Abnormal; Notable  for the following components:      Result Value   Hgb urine dipstick MODERATE (*)    Leukocytes,Ua TRACE (*)    All other components within normal limits  URINALYSIS, MICROSCOPIC (REFLEX) - Abnormal; Notable for the following components:   Bacteria, UA FEW (*)    All other components within normal limits    EKG None  Radiology No results found.  Procedures Pelvic exam  Date/Time: 09/24/2022 4:13 PM  Performed by: Fayrene Helper, PA-C Authorized by: Fayrene Helper, PA-C  Comments: Chaperone present during exam.  Normal external genitalia without any lesion or rash.  Urethral meatus is mildly protrude without any signs of trauma and no evidence of infection.  Normal vaginal vault.  Close cervical os and no evidence of uterine prolapse.       Medications Ordered in ED Medications - No data to display  ED Course/ Medical Decision Making/ A&P                           Medical Decision Making Amount and/or Complexity of Data Reviewed Labs: ordered.   BP 132/84 (BP Location: Right Arm)   Pulse 79   Temp 98.3 F (36.8 C) (Oral)   Resp 18   Ht 5\' 6"  (1.676 m)   Wt 74.8 kg   SpO2 98%   Breastfeeding Yes   BMI 26.63 kg/m   22:51 PM 21 year old female presenting with complaints of concerns for uterine prolapse.  Patient reports she recently gave birth a month ago.  It was a normal vaginal delivery.  3 weeks ago while squatting down when she felt something pushed out from her vagina and noticed a "ball" mass that protrudes out from her vagina which concerns her.  As she was googling and thought that it could be a uterine prolapse.  She would like to be evaluated for that.  She does not endorse any significant discomfort.  She has not had menstruation since recent delivery.  She does not have any other symptoms such as abdominal cramping, dysuria, vaginal bleeding or vaginal discharge.  On exam this is a well-appearing female sitting comfortably appears to be in no acute  discomfort.Her abdomen is soft and nontender. Chaperone present during exam.  Normal external genitalia without any lesion or rash.  Urethral meatus is mildly protrude without any signs of trauma and no evidence of infection.  Normal vaginal vault.  Close cervical os and no evidence of uterine prolapse.  I gave patient reassurance.  I encouraged patient to follow-up with her OB/GYN as schedule as her next appointment is in January.  She may discuss her concerns with her OB.  Gave patient return precaution.  Labs obtained including urinalysis which shows moderate hemoglobin on urine dipsticks and trace leukocyte esterase.  Finding is not consistent with a urinary  tract infection.  She does not have any CVA tenderness symptoms concerning for kidney stone.  As mentioned earlier, she does not have uterine prolapse.          Final Clinical Impression(s) / ED Diagnoses Final diagnoses:  Worried well    Rx / DC Orders ED Discharge Orders     None         Fayrene Helper, PA-C 09/24/22 1617    Tegeler, Canary Brim, MD 09/24/22 [phone]

## 2022-10-04 ENCOUNTER — Encounter: Payer: Medicaid Other | Admitting: Licensed Clinical Social Worker

## 2022-11-01 ENCOUNTER — Ambulatory Visit: Payer: Medicaid Other

## 2022-11-08 ENCOUNTER — Ambulatory Visit: Payer: Medicaid Other

## 2023-04-17 ENCOUNTER — Ambulatory Visit: Payer: MEDICAID

## 2023-04-17 VITALS — BP 98/56 | HR 70 | Ht 66.0 in

## 2023-04-17 DIAGNOSIS — N912 Amenorrhea, unspecified: Secondary | ICD-10-CM | POA: Diagnosis not present

## 2023-04-17 LAB — POCT URINE PREGNANCY: Preg Test, Ur: NEGATIVE

## 2023-04-17 NOTE — Progress Notes (Signed)
Patient would like to restart Depo Provera. Presents for pregnancy test.  Negative pregnancy test today.  Patient scheduled to return in two weeks to repeat pregnancy test and if negative at that visit she may restart depo provera. Patient states understanding. Armandina Stammer RN

## 2023-05-01 ENCOUNTER — Ambulatory Visit: Payer: MEDICAID

## 2024-01-09 ENCOUNTER — Ambulatory Visit: Payer: MEDICAID

## 2024-05-23 ENCOUNTER — Telehealth: Payer: Self-pay

## 2024-05-23 DIAGNOSIS — B379 Candidiasis, unspecified: Secondary | ICD-10-CM

## 2024-05-23 MED ORDER — TERCONAZOLE 0.4 % VA CREA: TOPICAL_CREAM | VAGINAL | 0 refills | Status: AC

## 2024-05-23 NOTE — Telephone Encounter (Signed)
 Patient called stating she is approximately [redacted] weeks pregnant and she tested positive for yeast at an Urgent care. She states Urgent care would not treat her. Terconazole  7 insert 1 applicator every night for 3 nights was sent to her pharmacy. Understanding was voiced. Jabier Deese l Byrne Capek, CMA +

## 2024-06-17 ENCOUNTER — Telehealth: Payer: Self-pay | Admitting: Family Medicine

## 2024-06-17 ENCOUNTER — Other Ambulatory Visit: Payer: Self-pay

## 2024-06-17 ENCOUNTER — Other Ambulatory Visit (HOSPITAL_COMMUNITY)
Admission: RE | Admit: 2024-06-17 | Discharge: 2024-06-17 | Disposition: A | Discharge status: Home / Self Care | Payer: MEDICAID | Source: Ambulatory Visit | Attending: Obstetrics and Gynecology | Admitting: Obstetrics and Gynecology

## 2024-06-17 ENCOUNTER — Ambulatory Visit (INDEPENDENT_AMBULATORY_CARE_PROVIDER_SITE_OTHER): Payer: MEDICAID

## 2024-06-17 VITALS — Wt 147.0 lb

## 2024-06-17 DIAGNOSIS — O0990 Supervision of high risk pregnancy, unspecified, unspecified trimester: Secondary | ICD-10-CM | POA: Diagnosis present

## 2024-06-17 LAB — POCT URINE PREGNANCY: Preg Test, Ur: POSITIVE — AB

## 2024-06-17 NOTE — Progress Notes (Signed)
 New OB Intake  I explained I am completing New OB Intake today. We discussed EDD of 01/08/2025, by Last Menstrual Period. Pt is G3P2002. I reviewed her allergies, medications and Medical/Surgical/OB history.    Patient Active Problem List   Diagnosis Date Noted   Supervision of high risk pregnancy, antepartum 06/17/2024   Tobacco abuse 06/10/2022   Mild intermittent asthma 06/11/2018    Concerns addressed today  Patient informed that the ultrasound is considered a limited obstetric ultrasound and is not intended to be a complete ultrasound exam.  Patient also informed that the ultrasound is not being completed with the intent of assessing for fetal or placental anomalies or any pelvic abnormalities. Explained that the purpose of today's ultrasound is to assess for viability.  Patient acknowledges the purpose of the exam and the limitations of the study.     Delivery Plans Plans to deliver at [facility]. Discussed the nature of our practice with multiple providers including residents and students. Due to the size of the practice, the delivering provider may not be the same as those providing prenatal care.   MyChart/Babyscripts MyChart access verified. I explained pt will have some visits in office and some virtually. Babyscripts app discussed and ordered.   Blood Pressure Cuff Blood pressure cuff discussedDiscussed to be used for virtual visits and or if needed BP checks weekly.  Anatomy US  Explained first scheduled US  will be around 19 weeks.   Last Pap No results found for: DIAGPAP  First visit review I reviewed new OB appt with patient. Explained pt will be seen by Dr. Barbra at first visit. Discussed [place] genetic screening with patient. Routine prenatal labs ordered.    Erminio DELENA Rumps, [place] 06/17/2024  3:48 PM

## 2024-06-17 NOTE — Telephone Encounter (Signed)
 Called patient and left VM with call back number about upcoming US  appt.

## 2024-06-18 ENCOUNTER — Ambulatory Visit: Payer: Self-pay | Admitting: Obstetrics and Gynecology

## 2024-06-18 LAB — CBC/D/PLT+RPR+RH+ABO+RUBIGG...
Antibody Screen: NEGATIVE
Basophils Absolute: 0.1 x10E3/uL (ref 0.0–0.2)
Basos: 1 %
EOS (ABSOLUTE): 0.1 x10E3/uL (ref 0.0–0.4)
Eos: 1 %
HCV Ab: NONREACTIVE
HIV Screen 4th Generation wRfx: NONREACTIVE
Hematocrit: 36.4 % (ref 34.0–46.6)
Hemoglobin: 11.7 g/dL (ref 11.1–15.9)
Hepatitis B Surface Ag: NEGATIVE
Immature Grans (Abs): 0 x10E3/uL (ref 0.0–0.1)
Immature Granulocytes: 0 %
Lymphocytes Absolute: 2.7 x10E3/uL (ref 0.7–3.1)
Lymphs: 37 %
MCH: 29 pg (ref 26.6–33.0)
MCHC: 32.1 g/dL (ref 31.5–35.7)
MCV: 90 fL (ref 79–97)
Monocytes Absolute: 0.3 x10E3/uL (ref 0.1–0.9)
Monocytes: 4 %
Neutrophils Absolute: 4.2 x10E3/uL (ref 1.4–7.0)
Neutrophils: 57 %
Platelets: 269 x10E3/uL (ref 150–450)
RBC: 4.04 x10E6/uL (ref 3.77–5.28)
RDW: 12.7 % (ref 11.7–15.4)
RPR Ser Ql: NONREACTIVE
Rh Factor: POSITIVE
Rubella Antibodies, IGG: 3.57 {index} (ref >0.99)
WBC: 7.4 x10E3/uL (ref 3.4–10.8)

## 2024-06-18 LAB — HCV INTERPRETATION

## 2024-06-19 ENCOUNTER — Other Ambulatory Visit: Payer: MEDICAID

## 2024-06-19 LAB — CERVICOVAGINAL ANCILLARY ONLY
Chlamydia: NEGATIVE
Comment: NEGATIVE
Comment: NORMAL
Neisseria Gonorrhea: NEGATIVE

## 2024-06-20 ENCOUNTER — Other Ambulatory Visit: Payer: Self-pay | Admitting: Obstetrics and Gynecology

## 2024-06-20 ENCOUNTER — Ambulatory Visit (INDEPENDENT_AMBULATORY_CARE_PROVIDER_SITE_OTHER): Payer: MEDICAID

## 2024-06-20 ENCOUNTER — Other Ambulatory Visit: Payer: Self-pay

## 2024-06-20 DIAGNOSIS — O0991 Supervision of high risk pregnancy, unspecified, first trimester: Secondary | ICD-10-CM | POA: Diagnosis not present

## 2024-06-20 DIAGNOSIS — O0990 Supervision of high risk pregnancy, unspecified, unspecified trimester: Secondary | ICD-10-CM

## 2024-06-20 DIAGNOSIS — Z3A12 12 weeks gestation of pregnancy: Secondary | ICD-10-CM | POA: Diagnosis not present

## 2024-06-20 LAB — URINE CULTURE, OB REFLEX

## 2024-06-20 LAB — CULTURE, OB URINE

## 2024-06-27 ENCOUNTER — Other Ambulatory Visit (HOSPITAL_COMMUNITY)
Admission: RE | Admit: 2024-06-27 | Discharge: 2024-06-27 | Disposition: A | Payer: MEDICAID | Source: Ambulatory Visit | Attending: Family Medicine | Admitting: Family Medicine

## 2024-06-27 ENCOUNTER — Ambulatory Visit (INDEPENDENT_AMBULATORY_CARE_PROVIDER_SITE_OTHER): Payer: MEDICAID | Admitting: Family Medicine

## 2024-06-27 VITALS — BP 92/56 | HR 74 | Wt 152.0 lb

## 2024-06-27 DIAGNOSIS — O0990 Supervision of high risk pregnancy, unspecified, unspecified trimester: Secondary | ICD-10-CM | POA: Insufficient documentation

## 2024-06-27 DIAGNOSIS — Z23 Encounter for immunization: Secondary | ICD-10-CM | POA: Diagnosis not present

## 2024-06-27 DIAGNOSIS — Z98891 History of uterine scar from previous surgery: Secondary | ICD-10-CM | POA: Diagnosis not present

## 2024-06-27 DIAGNOSIS — Z3A12 12 weeks gestation of pregnancy: Secondary | ICD-10-CM

## 2024-06-27 MED ORDER — ONDANSETRON 4 MG PO TBDP: 4.0000 mg | ORAL_TABLET | Freq: Four times a day (QID) | ORAL | 2 refills | Status: AC | PRN

## 2024-06-27 NOTE — Progress Notes (Signed)
 Subjective:  Phyllis Yates is a [id number] [redacted]w[redacted]d being seen today for her first obstetrical visit.  Her obstetrical history is significant for prior cesarean delivery, followed by successful VBAC. Patient does intend to breast feed. Pregnancy history fully reviewed.  Patient reports nausea.     07/19/2022   11:33 AM 05/31/2022    2:15 PM  Depression screen PHQ 2/9  Decreased Interest 1 1  Down, Depressed, Hopeless 1 2  PHQ - 2 Score 2 3  Altered sleeping 1   Tired, decreased energy 1 1  Change in appetite 1 2  Feeling bad or failure about yourself  1 2  Trouble concentrating 0 0  Moving slowly or fidgety/restless 0 0  Suicidal thoughts 0 0  PHQ-9 Score 6      BP (!) 92/56   Pulse 74   Wt 152 lb (68.9 kg)   LMP 04/03/2024   BMI 24.53 kg/m   HISTORY: OB History  Gravida Para Term Preterm AB Living  3 2 2   2   SAB IAB Ectopic Multiple Live Births     0 2    # Outcome Date GA Lbr Len/2nd Weight Sex Type Anes PTL Lv  3 Current           2 Term 08/08/22 [redacted]w[redacted]d 08:45 / 00:17 6 lb 5.8 oz (2.886 kg) F VBAC None N LIV  1 Term 05/16/19 [redacted]w[redacted]d  8 lb 5 oz (3.771 kg) M CS-LTranv  N LIV     Complications: Fetal Intolerance    Past Medical History:  Diagnosis Date   Asthma    Medical history non-contributory     Past Surgical History:  Procedure Laterality Date   CESAREAN SECTION N/A 05/16/2019   Procedure: CESAREAN SECTION;  Surgeon: Lorence Ozell CROME, MD;  Location: [facility] LD ORS;  Service: Obstetrics;  Laterality: N/A;   TYMPANOSTOMY TUBE PLACEMENT      Family History  Problem Relation Age of Onset   Urticaria Father    Asthma Brother    Eczema Brother    Urticaria Paternal Aunt    Urticaria Paternal Grandfather      Exam  BP (!) 92/56   Pulse 74   Wt 152 lb (68.9 kg)   LMP 04/03/2024   BMI 24.53 kg/m   Chaperone present during exam  CONSTITUTIONAL: Well-developed, well-nourished female in no acute distress.  HENT:  Normocephalic, atraumatic, External  right and left ear normal. Oropharynx is clear and moist EYES: Conjunctivae and EOM are normal. Pupils are equal, round, and reactive to light. No scleral icterus.  NECK: Normal range of motion, supple, no masses.  Normal thyroid .  CARDIOVASCULAR: Normal heart rate noted, regular rhythm RESPIRATORY: Clear to auscultation bilaterally. Effort and breath sounds normal, no problems with respiration noted. BREASTS: Symmetric in size. No masses, skin changes, nipple drainage, or lymphadenopathy. ABDOMEN: Soft, normal bowel sounds, no distention noted.  No tenderness, rebound or guarding.  PELVIC: Normal appearing external genitalia; normal appearing vaginal mucosa and cervix. No abnormal discharge noted. Normal uterine size, no other palpable masses, no uterine or adnexal tenderness. MUSCULOSKELETAL: Normal range of motion. No tenderness.  No cyanosis, clubbing, or edema.  2+ distal pulses. SKIN: Skin is warm and dry. No rash noted. Not diaphoretic. No erythema. No pallor. NEUROLOGIC: Alert and oriented to person, place, and time. Normal reflexes, muscle tone coordination. No cranial nerve deficit noted. PSYCHIATRIC: Normal mood and affect. Normal behavior. Normal judgment and thought content.    Assessment:  Pregnancy: [id number] Patient Active Problem List   Diagnosis Date Noted   Supervision of high risk pregnancy, antepartum 06/17/2024   Tobacco abuse 06/10/2022   Mild intermittent asthma 06/11/2018      Plan:   1. Supervision of high risk pregnancy, antepartum (Primary) FHT normal - Flu vaccine trivalent PF, 6mos and older(Flulaval,Afluria,Fluarix,Fluzone) - Cytology - PAP( [place]) - US  MFM OB DETAIL +14 WK; Future  2. [redacted] weeks gestation of pregnancy - Flu vaccine trivalent PF, 6mos and older(Flulaval,Afluria,Fluarix,Fluzone) - US  MFM OB DETAIL +14 WK; Future  3. History of cesarean section Desires rpt TOLAC - US  MFM OB DETAIL +14 WK; Future    Initial labs  obtained Continue prenatal vitamins Reviewed n/v relief measures and warning s/s to report Reviewed recommended weight gain based on pre-gravid BMI Encouraged well-balanced diet Genetic & carrier screening discussed: requests [employer] and [employer] ,  Ultrasound discussed; fetal survey: requested CCNC completed> form faxed if has or is planning to apply for medicaid The nature of [facility] [employer] with multiple MDs and other Advanced Practice Providers was explained to patient; also emphasized that fellows, residents, and students are part of our team.     Problem list reviewed and updated. 75% of 30 min visit spent on counseling and coordination of care.     Andrw Mcguirt J Willodean Leven 06/27/2024

## 2024-07-01 ENCOUNTER — Telehealth: Payer: Self-pay

## 2024-07-01 ENCOUNTER — Ambulatory Visit: Payer: Self-pay | Admitting: Family Medicine

## 2024-07-01 LAB — CYTOLOGY - PAP: Diagnosis: NEGATIVE

## 2024-07-11 ENCOUNTER — Other Ambulatory Visit: Payer: MEDICAID

## 2024-07-11 DIAGNOSIS — Z3A14 14 weeks gestation of pregnancy: Secondary | ICD-10-CM

## 2024-07-12 LAB — HEMOGLOBIN A1C
Est. average glucose Bld gHb Est-mCnc: 105 mg/dL
Hgb A1c MFr Bld: 5.3 % (ref 4.8–5.6)

## 2024-07-18 ENCOUNTER — Ambulatory Visit: Payer: Self-pay | Admitting: Family Medicine

## 2024-07-18 DIAGNOSIS — O0990 Supervision of high risk pregnancy, unspecified, unspecified trimester: Secondary | ICD-10-CM

## 2024-07-18 LAB — PANORAMA PRENATAL TEST FULL PANEL:PANORAMA TEST PLUS 5 ADDITIONAL MICRODELETIONS: FETAL FRACTION: 8.6

## 2024-07-23 LAB — HORIZON CUSTOM: REPORT SUMMARY: NEGATIVE

## 2024-07-25 ENCOUNTER — Encounter: Payer: MEDICAID | Admitting: Family Medicine

## 2024-07-26 ENCOUNTER — Telehealth: Payer: Self-pay

## 2024-07-26 NOTE — Telephone Encounter (Signed)
 Left voicemail for patient to call back and reschedule her missed OB appointment.

## 2024-08-05 DIAGNOSIS — Z98891 History of uterine scar from previous surgery: Secondary | ICD-10-CM | POA: Insufficient documentation

## 2024-08-05 DIAGNOSIS — O34219 Maternal care for unspecified type scar from previous cesarean delivery: Secondary | ICD-10-CM | POA: Insufficient documentation

## 2024-08-08 ENCOUNTER — Ambulatory Visit: Payer: MEDICAID | Admitting: Family Medicine

## 2024-08-08 VITALS — BP 94/48 | HR 62 | Wt 154.0 lb

## 2024-08-08 DIAGNOSIS — O0990 Supervision of high risk pregnancy, unspecified, unspecified trimester: Secondary | ICD-10-CM

## 2024-08-08 DIAGNOSIS — Z98891 History of uterine scar from previous surgery: Secondary | ICD-10-CM

## 2024-08-08 DIAGNOSIS — Z3A18 18 weeks gestation of pregnancy: Secondary | ICD-10-CM

## 2024-08-08 DIAGNOSIS — O34219 Maternal care for unspecified type scar from previous cesarean delivery: Secondary | ICD-10-CM

## 2024-08-08 NOTE — Progress Notes (Signed)
   PRENATAL VISIT NOTE  Subjective:  Phyllis Yates is a 23 y.o. G3P2002 at [redacted]w[redacted]d being seen today for ongoing prenatal care.  She is currently monitored for the following issues for this high-risk pregnancy and has Mild intermittent asthma; Tobacco abuse; Supervision of high risk pregnancy, antepartum; Pregnancy with history of cesarean section, antepartum; and History of VBAC on their problem list.  Patient reports no complaints.  Contractions: Not present. Vag. Bleeding: None.  Movement: Present. Denies leaking of fluid.   The following portions of the patient's history were reviewed and updated as appropriate: allergies, current medications, past family history, past medical history, past social history, past surgical history and problem list.   Objective:   Vitals:   08/08/24 1406  BP: (!) 94/48  Pulse: 62  Weight: 154 lb (69.9 kg)    Fetal Status:  Fetal Heart Rate (bpm): 148   Movement: Present    General: Alert, oriented and cooperative. Patient is in no acute distress.  Skin: Skin is warm and dry. No rash noted.   Cardiovascular: Normal heart rate noted  Respiratory: Normal respiratory effort, no problems with respiration noted  Abdomen: Soft, gravid, appropriate for gestational age.  Pain/Pressure: Present     Pelvic: Cervical exam deferred        Extremities: Normal range of motion.  Edema: None  Mental Status: Normal mood and affect. Normal behavior. Normal judgment and thought content.      07/19/2022   11:33 AM 05/31/2022    2:15 PM  Depression screen PHQ 2/9  Decreased Interest 1 1  Down, Depressed, Hopeless 1 2  PHQ - 2 Score 2 3  Altered sleeping 1   Tired, decreased energy 1 1  Change in appetite 1 2  Feeling bad or failure about yourself  1 2  Trouble concentrating 0 0  Moving slowly or fidgety/restless 0 0  Suicidal thoughts 0 0  PHQ-9 Score 6       Data saved with a previous flowsheet row definition        07/19/2022   11:34 AM 05/31/2022     2:15 PM  GAD 7 : Generalized Anxiety Score  Nervous, Anxious, on Edge 0 0  Control/stop worrying 2 0  Worry too much - different things 2 2  Trouble relaxing 1 2  Restless 0 1  Easily annoyed or irritable 1 1  Afraid - awful might happen 0 0  Total GAD 7 Score 6 6    Assessment and Plan:  Pregnancy: G3P2002 at [redacted]w[redacted]d 1. [redacted] weeks gestation of pregnancy (Primary)  2. Supervision of high risk pregnancy, antepartum FHT normal Declines AFP  3. History of VBAC Currently thinking of TOLAC  4. Pregnancy with history of cesarean section, antepartum   Preterm labor symptoms and general obstetric precautions including but not limited to vaginal bleeding, contractions, leaking of fluid and fetal movement were reviewed in detail with the patient. Please refer to After Visit Summary for other counseling recommendations.   No follow-ups on file.  Future Appointments  Date Time Provider Department Center  08/19/2024 10:00 AM [facility] PROVIDER 1 [facility]  08/19/2024 10:30 AM [facility] US1 [facility]  08/22/2024  2:10 PM Sativa Gelles J, DO [facility] None    Semir Brill J Lavon Horn, DO

## 2024-08-19 ENCOUNTER — Ambulatory Visit: Payer: MEDICAID

## 2024-08-19 ENCOUNTER — Other Ambulatory Visit: Payer: MEDICAID

## 2024-08-19 DIAGNOSIS — Z98891 History of uterine scar from previous surgery: Secondary | ICD-10-CM

## 2024-08-19 DIAGNOSIS — O34219 Maternal care for unspecified type scar from previous cesarean delivery: Secondary | ICD-10-CM

## 2024-08-22 ENCOUNTER — Encounter: Payer: MEDICAID | Admitting: Family Medicine

## 2024-09-02 ENCOUNTER — Ambulatory Visit

## 2024-09-05 ENCOUNTER — Encounter: Payer: MEDICAID | Admitting: Family Medicine

## 2024-09-30 ENCOUNTER — Ambulatory Visit (HOSPITAL_BASED_OUTPATIENT_CLINIC_OR_DEPARTMENT_OTHER)

## 2024-09-30 ENCOUNTER — Ambulatory Visit: Attending: Maternal & Fetal Medicine | Admitting: Maternal & Fetal Medicine

## 2024-09-30 VITALS — BP 96/53 | HR 72

## 2024-09-30 DIAGNOSIS — Z87891 Personal history of nicotine dependence: Secondary | ICD-10-CM | POA: Insufficient documentation

## 2024-09-30 DIAGNOSIS — J45909 Unspecified asthma, uncomplicated: Secondary | ICD-10-CM | POA: Insufficient documentation

## 2024-09-30 DIAGNOSIS — Z98891 History of uterine scar from previous surgery: Secondary | ICD-10-CM | POA: Diagnosis not present

## 2024-09-30 DIAGNOSIS — Z3A12 12 weeks gestation of pregnancy: Secondary | ICD-10-CM

## 2024-09-30 DIAGNOSIS — O99891 Other specified diseases and conditions complicating pregnancy: Secondary | ICD-10-CM | POA: Diagnosis not present

## 2024-09-30 DIAGNOSIS — O34211 Maternal care for low transverse scar from previous cesarean delivery: Secondary | ICD-10-CM | POA: Insufficient documentation

## 2024-09-30 DIAGNOSIS — O321XX0 Maternal care for breech presentation, not applicable or unspecified: Secondary | ICD-10-CM | POA: Diagnosis not present

## 2024-09-30 DIAGNOSIS — O0990 Supervision of high risk pregnancy, unspecified, unspecified trimester: Secondary | ICD-10-CM

## 2024-09-30 DIAGNOSIS — O99332 Smoking (tobacco) complicating pregnancy, second trimester: Secondary | ICD-10-CM | POA: Insufficient documentation

## 2024-09-30 DIAGNOSIS — Z3A25 25 weeks gestation of pregnancy: Secondary | ICD-10-CM | POA: Diagnosis not present

## 2024-09-30 NOTE — Progress Notes (Signed)
 "  Patient information  Patient Name: Phyllis Yates [facility]  Patient MRN:   [id number]  Referring practice: [facility] Referring Provider: [facility] [place] ([facility])  Problem List   Patient Active Problem List   Diagnosis Date Noted   Pregnancy with history of cesarean section, antepartum 08/05/2024   History of VBAC 08/05/2024   Supervision of high risk pregnancy, antepartum 06/17/2024   Tobacco abuse 06/10/2022   Mild intermittent asthma 06/11/2018    Maternal Fetal Medicine Consult [facility] ERCK is a 23 y.o. G3P2002 at [redacted]w[redacted]d here for ultrasound and consultation. She had low risk aneuploidy screening of a female fetus. She has no acute concerns.   Today we focused on the following:   The patient presents for an anatomy ultrasound at 25 weeks and 5 days gestation. She has a history of one prior cesarean delivery followed by a successful VBAC and desires a VBAC in this pregnancy. Her medical history is notable for prior tobacco use, with sustained cessation for several years, and mild asthma for which she is not currently using an inhaler. We discussed continued tobacco abstinence and monitoring for asthma symptoms. The role of albuterol  as first-line therapy was reviewed, including the recommendation to notify her OB provider if rescue inhaler use exceeds twice weekly or occurs nightly. Todays anatomy ultrasound is reassuring with no abnormalities identified. She will continue routine prenatal care with her OB provider.  RECOMMENDATIONS -Continue tobacco cessation -Monitor asthma symptoms and use albuterol  as first-line therapy if needed -Notify OB provider if albuterol  is required more than twice weekly or nightly -Continue routine prenatal care with primary OB provider -Consider VBAC planning and counseling with OB provider  45 minutes of time was spent reviewing the patient's chart including labs, imaging and documentation.  At least 50% of this time was spent with direct patient  care discussing the diagnosis, management and prognosis of her care.  Review of Systems: A review of systems was performed and was negative except per HPI   Past Obstetrical History:  OB History  Gravida Para Term Preterm AB Living  3 2 2   2   SAB IAB Ectopic Multiple Live Births     0 2    # Outcome Date GA Lbr Len/2nd Weight Sex Type Anes PTL Lv  3 Current           2 Term 08/08/22 [redacted]w[redacted]d 08:45 / 00:17 6 lb 5.8 oz (2.886 kg) F VBAC None N LIV  1 Term 05/16/19 [redacted]w[redacted]d  8 lb 5 oz (3.771 kg) M CS-LTranv  N LIV     Complications: Fetal Intolerance     Past Medical History:  Past Medical History:  Diagnosis Date   Asthma      Past Surgical History:    Past Surgical History:  Procedure Laterality Date   CESAREAN SECTION N/A 05/16/2019   Procedure: CESAREAN SECTION;  Surgeon: Lorence Ozell CROME, MD;  Location: [facility] LD ORS;  Service: Obstetrics;  Laterality: N/A;   TYMPANOSTOMY TUBE PLACEMENT       Home Medications:   Medications Ordered Prior to Encounter[1]    Allergies:   Allergies[2]   Physical Exam:   Vitals:   09/30/24 1315  BP: (!) 96/53  Pulse: 72   Sitting comfortably on the sonogram table Nonlabored breathing Normal rate and rhythm Abdomen is nontender  Thank you for the opportunity to be involved with this patient's care. Please let us  know if we can be of any further assistance.  Delora Smaller MFM, [place]   09/30/2024  2:16 PM      [1]  Current Outpatient Medications on File Prior to Visit  Medication Sig Dispense Refill   ferrous sulfate 324 MG TBEC Take 324 mg by mouth.     Prenatal Vit-Fe Fumarate-FA (MULTIVITAMIN-PRENATAL) 27-0.8 MG TABS tablet Take 1 tablet by mouth daily at 12 noon.     ibuprofen  (ADVIL ) 600 MG tablet Take 1 tablet (600 mg total) by mouth every 6 (six) hours as needed. (Patient not taking: Reported on 08/08/2024) 60 tablet 0   nicotine  (NICODERM CQ  - DOSED IN MG/24 HOURS) 21 mg/24hr patch Place 1 patch (21 mg total) onto the  skin daily. (Patient not taking: Reported on 08/08/2024) 28 patch 1   ondansetron  (ZOFRAN -ODT) 4 MG disintegrating tablet Take 1 tablet (4 mg total) by mouth every 6 (six) hours as needed for nausea. (Patient not taking: Reported on 08/08/2024) 60 tablet 2   terconazole  (TERAZOL 7 ) 0.4 % vaginal cream Insert 1 applicator vaginally for 3 nights. (Patient not taking: Reported on 06/27/2024) 45 g 0   No current facility-administered medications on file prior to visit.  [2] No Known Allergies  "

## 2024-10-16 ENCOUNTER — Ambulatory Visit: Admitting: Family Medicine

## 2024-10-16 VITALS — BP 102/65 | HR 75 | Wt 171.1 lb

## 2024-10-16 DIAGNOSIS — Z3A28 28 weeks gestation of pregnancy: Secondary | ICD-10-CM | POA: Diagnosis not present

## 2024-10-16 DIAGNOSIS — O34219 Maternal care for unspecified type scar from previous cesarean delivery: Secondary | ICD-10-CM

## 2024-10-16 DIAGNOSIS — O0990 Supervision of high risk pregnancy, unspecified, unspecified trimester: Secondary | ICD-10-CM

## 2024-10-16 DIAGNOSIS — Z98891 History of uterine scar from previous surgery: Secondary | ICD-10-CM

## 2024-10-17 NOTE — Progress Notes (Signed)
" ° °  PRENATAL VISIT NOTE  Subjective:  Phyllis Yates is a 24 y.o. G3P2002 at [redacted]w[redacted]d being seen today for ongoing prenatal care.  She is currently monitored for the following issues for this high-risk pregnancy and has Mild intermittent asthma; Tobacco abuse; Supervision of high risk pregnancy, antepartum; Pregnancy with history of cesarean section, antepartum; and History of VBAC on their problem list.  Patient reports no complaints.  Contractions: Irritability. Vag. Bleeding: None.  Movement: Present. Denies leaking of fluid.   The following portions of the patient's history were reviewed and updated as appropriate: allergies, current medications, past family history, past medical history, past social history, past surgical history and problem list.   Objective:   Vitals:   10/16/24 1123  BP: 102/65  Pulse: 75  Weight: 171 lb 1.3 oz (77.6 kg)    Fetal Status:  Fetal Heart Rate (bpm): 143   Movement: Present    General: Alert, oriented and cooperative. Patient is in no acute distress.  Skin: Skin is warm and dry. No rash noted.   Cardiovascular: Normal heart rate noted  Respiratory: Normal respiratory effort, no problems with respiration noted  Abdomen: Soft, gravid, appropriate for gestational age.  Pain/Pressure: Present     Pelvic: Cervical exam deferred        Extremities: Normal range of motion.  Edema: None  Mental Status: Normal mood and affect. Normal behavior. Normal judgment and thought content.      10/16/2024   11:49 AM 07/19/2022   11:33 AM 05/31/2022    2:15 PM  Depression screen PHQ 2/9  Decreased Interest 1 1 1   Down, Depressed, Hopeless 1 1 2   PHQ - 2 Score 2 2 3   Altered sleeping 0 1   Tired, decreased energy 1 1 1   Change in appetite 1 1 2   Feeling bad or failure about yourself  1 1 2   Trouble concentrating 0 0 0  Moving slowly or fidgety/restless 0 0 0  Suicidal thoughts 0 0 0  PHQ-9 Score 5 6       Data saved with a previous flowsheet row  definition        10/16/2024   11:50 AM 07/19/2022   11:34 AM 05/31/2022    2:15 PM  GAD 7 : Generalized Anxiety Score  Nervous, Anxious, on Edge 0 0 0  Control/stop worrying 0 2 0  Worry too much - different things 0 2 2  Trouble relaxing 2 1 2   Restless 0 0 1  Easily annoyed or irritable 1 1 1   Afraid - awful might happen 0 0 0  Total GAD 7 Score 3 6 6     Assessment and Plan:  Pregnancy: G3P2002 at [redacted]w[redacted]d 1. [redacted] weeks gestation of pregnancy (Primary)  2. Supervision of high risk pregnancy, antepartum FHT normal  3. History of VBAC Desires TOLAC again  4. Pregnancy with history of cesarean section, antepartum Consent signed  Preterm labor symptoms and general obstetric precautions including but not limited to vaginal bleeding, contractions, leaking of fluid and fetal movement were reviewed in detail with the patient. Please refer to After Visit Summary for other counseling recommendations.   No follow-ups on file.  Future Appointments  Date Time Provider Department Center  10/25/2024  8:00 AM [facility] LAB [facility] None  12/03/2024 11:15 AM [facility] PROVIDER 1 [facility]  12/03/2024 11:30 AM [facility] US4 [facility]    Lang JINNY Peel, DO "

## 2024-10-25 ENCOUNTER — Other Ambulatory Visit

## 2024-10-31 ENCOUNTER — Other Ambulatory Visit

## 2024-11-07 ENCOUNTER — Encounter: Admitting: Family Medicine

## 2024-11-12 ENCOUNTER — Encounter

## 2024-11-13 ENCOUNTER — Other Ambulatory Visit

## 2024-11-21 ENCOUNTER — Encounter: Admitting: Family Medicine

## 2024-12-03 ENCOUNTER — Other Ambulatory Visit

## 2024-12-03 ENCOUNTER — Ambulatory Visit

## 2024-12-05 ENCOUNTER — Encounter: Admitting: Family Medicine

## 2024-12-12 ENCOUNTER — Encounter: Admitting: Family Medicine

## 2024-12-18 ENCOUNTER — Encounter: Admitting: Obstetrics and Gynecology

## 2024-12-26 ENCOUNTER — Encounter: Admitting: Family Medicine

## 2025-01-01 ENCOUNTER — Encounter: Admitting: Family Medicine

## 2025-01-08 ENCOUNTER — Encounter: Admitting: Obstetrics and Gynecology
